# Patient Record
Sex: Female | Born: 1985 | Race: White | Hispanic: No | Marital: Single | Smoking: Former smoker
Health system: Southern US, Community
[De-identification: ages and names within clinical notes are randomized; demographics above are authoritative.]

## PROBLEM LIST (undated history)

## (undated) DIAGNOSIS — F419 Anxiety disorder, unspecified: Secondary | ICD-10-CM

## (undated) DIAGNOSIS — F32A Depression, unspecified: Secondary | ICD-10-CM

## (undated) DIAGNOSIS — F329 Major depressive disorder, single episode, unspecified: Secondary | ICD-10-CM

## (undated) DIAGNOSIS — F410 Panic disorder [episodic paroxysmal anxiety] without agoraphobia: Secondary | ICD-10-CM

## (undated) HISTORY — DX: Major depressive disorder, single episode, unspecified: F32.9

## (undated) HISTORY — PX: MOUTH SURGERY: SHX715

## (undated) HISTORY — DX: Depression, unspecified: F32.A

---

## 2008-01-21 ENCOUNTER — Emergency Department (HOSPITAL_COMMUNITY): Admission: EM | Admit: 2008-01-21 | Discharge: 2008-01-21 | Payer: Self-pay | Admitting: Emergency Medicine

## 2009-02-13 ENCOUNTER — Emergency Department (HOSPITAL_COMMUNITY): Admission: EM | Admit: 2009-02-13 | Discharge: 2009-02-13 | Payer: Self-pay | Admitting: Emergency Medicine

## 2010-07-18 LAB — POCT URINALYSIS DIP (DEVICE)
Bilirubin Urine: NEGATIVE
Glucose, UA: NEGATIVE mg/dL
Nitrite: NEGATIVE
Urobilinogen, UA: 0.2 mg/dL (ref 0.0–1.0)

## 2010-07-18 LAB — GC/CHLAMYDIA PROBE AMP, GENITAL
Chlamydia, DNA Probe: NEGATIVE
GC Probe Amp, Genital: NEGATIVE

## 2010-07-18 LAB — WET PREP, GENITAL

## 2011-01-22 ENCOUNTER — Inpatient Hospital Stay (INDEPENDENT_AMBULATORY_CARE_PROVIDER_SITE_OTHER)
Admission: RE | Admit: 2011-01-22 | Discharge: 2011-01-22 | Disposition: A | Discharge status: Home / Self Care | Payer: Self-pay | Source: Ambulatory Visit | Attending: Family Medicine | Admitting: Family Medicine

## 2011-01-22 DIAGNOSIS — L259 Unspecified contact dermatitis, unspecified cause: Secondary | ICD-10-CM

## 2011-04-11 ENCOUNTER — Encounter: Payer: Self-pay | Admitting: *Deleted

## 2011-04-11 ENCOUNTER — Emergency Department (INDEPENDENT_AMBULATORY_CARE_PROVIDER_SITE_OTHER)
Admission: EM | Admit: 2011-04-11 | Discharge: 2011-04-11 | Disposition: A | Discharge status: Home / Self Care | Payer: Self-pay | Source: Home / Self Care | Attending: Family Medicine | Admitting: Family Medicine

## 2011-04-11 DIAGNOSIS — F410 Panic disorder [episodic paroxysmal anxiety] without agoraphobia: Secondary | ICD-10-CM

## 2011-04-11 HISTORY — DX: Panic disorder (episodic paroxysmal anxiety): F41.0

## 2011-04-11 NOTE — ED Notes (Signed)
Pt     Reports  She  Had  A  Panic  Attack  This   Am   At  Work       With  Shortness of  Breath and  Anxiety   At  This  Time  She  Reports  That  She  Feels  Somewhat better  But  Her  Boss told  Her  To  Get  [employer]

## 2011-04-11 NOTE — ED Provider Notes (Signed)
History     CSN: [id number]  Arrival date & time 04/11/11  1111   First MD Initiated Contact with Patient 04/11/11 1222      Chief Complaint  Patient presents with  . Panic Attack    (Consider location/radiation/quality/duration/timing/severity/associated sxs/prior treatment) HPI Comments: Melissa Vang presents for evaluation after experiencing a panic attack at work this morning. She reports a hx of ONE panic attack, where she saw a health care provider but was not medicated. She denies any hx of mental disease, no suicidal ideation, no homicidal ideation. She reports lots of stress at work. She reports that her boss requested a medical evaluation as well as a note to return to work. Patient is a 25 y.o. female presenting with anxiety. The history is provided by the patient.  Anxiety This is a new problem. The current episode started 3 to 5 hours ago. The problem has been resolved. Associated symptoms include chest pain and shortness of breath. The symptoms are aggravated by nothing. The symptoms are relieved by nothing.    Past Medical History  Diagnosis Date  . Panic attack     Past Surgical History  Procedure Date  . Mouth surgery     Family History  Problem Relation Age of Onset  . Cancer Mother   . Diabetes Father     History  Substance Use Topics  . Smoking status: Current Everyday Smoker  . Smokeless tobacco: Not on file  . Alcohol Use: Yes    OB History    Grav Para Term Preterm Abortions TAB SAB Ect Mult Living                  Review of Systems  Constitutional: Negative.   HENT: Negative.   Eyes: Negative.   Respiratory: Positive for shortness of breath.   Cardiovascular: Positive for chest pain.  Gastrointestinal: Negative.   Genitourinary: Negative.   Musculoskeletal: Negative.   Skin: Negative.   Neurological: Negative.   Psychiatric/Behavioral: The patient is nervous/anxious.     Allergies  Penicillins  Home Medications  No current  outpatient prescriptions on file.  BP 112/70  Pulse 72  Temp(Src) 98.5 F (36.9 C) (Oral)  Resp 16  SpO2 97%  LMP 03/16/2011  Physical Exam  Nursing note and vitals reviewed. Constitutional: She is oriented to person, place, and time. She appears well-developed and well-nourished.  HENT:  Head: Normocephalic and atraumatic.  Eyes: Conjunctivae, EOM and lids are normal. Pupils are equal, round, and reactive to light.  Neck: Normal range of motion.  Cardiovascular: Normal rate and regular rhythm.   Pulmonary/Chest: Effort normal and breath sounds normal. She has no wheezes.  Musculoskeletal: Normal range of motion.  Neurological: She is alert and oriented to person, place, and time.  Skin: Skin is warm and dry.  Psychiatric: She has a normal mood and affect. Her speech is normal and behavior is normal. Judgment and thought content normal. Cognition and memory are normal.       Denies any SI or HI and states that the panic attack has resolved    ED Course  Procedures (including critical care time)  Labs Reviewed - No data to display No results found.   1. Panic attack       MDM  Resolved; given note for work; advised to follow up with PCP or [facility] should symptoms persist        Richardo Priest, MD 04/21/11 1738

## 2012-05-04 ENCOUNTER — Emergency Department (HOSPITAL_COMMUNITY)
Admission: EM | Admit: 2012-05-04 | Discharge: 2012-05-04 | Disposition: A | Discharge status: Home / Self Care | Payer: Self-pay | Attending: Emergency Medicine | Admitting: Emergency Medicine

## 2012-05-04 ENCOUNTER — Encounter (HOSPITAL_COMMUNITY): Payer: Self-pay | Admitting: *Deleted

## 2012-05-04 DIAGNOSIS — N946 Dysmenorrhea, unspecified: Secondary | ICD-10-CM | POA: Insufficient documentation

## 2012-05-04 DIAGNOSIS — N760 Acute vaginitis: Secondary | ICD-10-CM | POA: Insufficient documentation

## 2012-05-04 DIAGNOSIS — N390 Urinary tract infection, site not specified: Secondary | ICD-10-CM | POA: Insufficient documentation

## 2012-05-04 DIAGNOSIS — F17200 Nicotine dependence, unspecified, uncomplicated: Secondary | ICD-10-CM | POA: Insufficient documentation

## 2012-05-04 DIAGNOSIS — F410 Panic disorder [episodic paroxysmal anxiety] without agoraphobia: Secondary | ICD-10-CM | POA: Insufficient documentation

## 2012-05-04 DIAGNOSIS — Z7982 Long term (current) use of aspirin: Secondary | ICD-10-CM | POA: Insufficient documentation

## 2012-05-04 DIAGNOSIS — Z3202 Encounter for pregnancy test, result negative: Secondary | ICD-10-CM | POA: Insufficient documentation

## 2012-05-04 LAB — CBC WITH DIFFERENTIAL/PLATELET
Basophils Absolute: 0 K/uL (ref 0.0–0.1)
Basophils Relative: 0 % (ref 0–1)
Eosinophils Absolute: 0.1 K/uL (ref 0.0–0.7)
Eosinophils Relative: 1 % (ref 0–5)
HCT: 42.2 % (ref 36.0–46.0)
Hemoglobin: 15.1 g/dL — ABNORMAL HIGH (ref 12.0–15.0)
Lymphocytes Relative: 19 % (ref 12–46)
Lymphs Abs: 1.3 10*3/uL (ref 0.7–4.0)
MCH: 32.1 pg (ref 26.0–34.0)
MCHC: 35.8 g/dL (ref 30.0–36.0)
MCV: 89.8 fL (ref 78.0–100.0)
Monocytes Absolute: 0.3 10*3/uL (ref 0.1–1.0)
Monocytes Relative: 5 % (ref 3–12)
Neutro Abs: 4.8 10*3/uL (ref 1.7–7.7)
Neutrophils Relative %: 74 % (ref 43–77)
Platelets: 202 K/uL (ref 150–400)
RBC: 4.7 MIL/uL (ref 3.87–5.11)
RDW: 12.4 % (ref 11.5–15.5)
WBC: 6.5 K/uL (ref 4.0–10.5)

## 2012-05-04 LAB — URINALYSIS, ROUTINE W REFLEX MICROSCOPIC
Bilirubin Urine: NEGATIVE
Glucose, UA: NEGATIVE mg/dL
Ketones, ur: NEGATIVE mg/dL
Nitrite: NEGATIVE
Protein, ur: NEGATIVE mg/dL
Specific Gravity, Urine: 1.02 (ref 1.005–1.030)
Urobilinogen, UA: 0.2 mg/dL (ref 0.0–1.0)
pH: 5.5 (ref 5.0–8.0)

## 2012-05-04 LAB — COMPREHENSIVE METABOLIC PANEL
Albumin: 4.3 g/dL (ref 3.5–5.2)
Alkaline Phosphatase: 12 U/L — ABNORMAL LOW (ref 39–117)
BUN: 13 mg/dL (ref 6–23)
CO2: 24 mEq/L (ref 19–32)
Chloride: 103 mEq/L (ref 96–112)
Creatinine, Ser: 1.02 mg/dL (ref 0.50–1.10)
GFR calc Af Amer: 87 mL/min — ABNORMAL LOW (ref >90)
GFR calc non Af Amer: 75 mL/min — ABNORMAL LOW (ref >90)
Glucose, Bld: 87 mg/dL (ref 70–99)
Potassium: 3.5 mEq/L (ref 3.5–5.1)
Total Bilirubin: 0.5 mg/dL (ref 0.3–1.2)

## 2012-05-04 LAB — COMPREHENSIVE METABOLIC PANEL WITH GFR
ALT: 9 U/L (ref 0–35)
AST: 17 U/L (ref 0–37)
Calcium: 9.9 mg/dL (ref 8.4–10.5)
Sodium: 140 meq/L (ref 135–145)
Total Protein: 7.5 g/dL (ref 6.0–8.3)

## 2012-05-04 LAB — URINE MICROSCOPIC-ADD ON

## 2012-05-04 LAB — PREGNANCY, URINE: Preg Test, Ur: NEGATIVE

## 2012-05-04 LAB — LIPASE, BLOOD: Lipase: 39 U/L (ref 11–59)

## 2012-05-04 MED ORDER — METRONIDAZOLE 500 MG PO TABS: 500.0000 mg | ORAL_TABLET | Freq: Two times a day (BID) | ORAL | Status: DC

## 2012-05-04 MED ORDER — CIPROFLOXACIN HCL 500 MG PO TABS: 500.0000 mg | ORAL_TABLET | Freq: Two times a day (BID) | ORAL | Status: DC

## 2012-05-04 MED ORDER — ONDANSETRON HCL 4 MG PO TABS: 4.0000 mg | ORAL_TABLET | Freq: Four times a day (QID) | ORAL | Status: DC

## 2012-05-04 MED ORDER — HYDROCODONE-ACETAMINOPHEN 5-325 MG PO TABS: 1.0000 | ORAL_TABLET | ORAL | Status: DC | PRN

## 2012-05-04 NOTE — ED Provider Notes (Signed)
Medical screening examination/treatment/procedure(s) were performed by non-physician practitioner and as supervising physician I was immediately available for consultation/collaboration.   Gavin Pound. Amardeep Beckers, MD 05/04/12 1745

## 2012-05-04 NOTE — ED Notes (Signed)
The pt hashad nausea vomiting with lower abd pain since this am.  lmp dec 15th

## 2012-05-04 NOTE — ED Provider Notes (Signed)
History     CSN: [id number]  Arrival date & time 05/04/12  1453   First MD Initiated Contact with Patient 05/04/12 1629      Chief Complaint  Patient presents with  . Abdominal Pain    (Consider location/radiation/quality/duration/timing/severity/associated sxs/prior treatment) HPI  Patient presents to the ED with complaints of low abdominal pains that started over 1 week ago. She was seen a week ago and given one dose of medication while in the ER and prescriptions for antibiotics which she never got filled. She didn't get them filled because she said she could not afford them. This morning she began having nausea and spit up one time "only a mouthful" one time. The patient brought her paper work from the hospital in with her and it shows she was diagnosed with vaginal infection. She was given her first dose of Flagyl, doxy and Azithro in ED but was unable to follow-up. nad vss   Past Medical History  Diagnosis Date  . Panic attack     Past Surgical History  Procedure Date  . Mouth surgery     Family History  Problem Relation Age of Onset  . Cancer Mother   . Diabetes Father     History  Substance Use Topics  . Smoking status: Current Every Day Smoker  . Smokeless tobacco: Not on file  . Alcohol Use: Yes    OB History    Grav Para Term Preterm Abortions TAB SAB Ect Mult Living                  Review of Systems  Review of Systems  Gen: no weight loss, fevers, chills, night sweats  Eyes: no discharge or drainage, no occular pain or visual changes  Nose: no epistaxis or rhinorrhea  Mouth: no dental pain, no sore throat  Neck: no neck pain  Lungs:No wheezing, coughing or hemoptysis CV: no chest pain, palpitations, dependent edema or orthopnea  Abd: + suprapubic abdominal pain, nausea, small amount of vomiting  GU: + dysuria no gross hematuria  MSK:  No abnormalities  Neuro: no headache, no focal neurologic deficits  Skin: no abnormalities Psyche:  negative.   Allergies  Penicillins  Home Medications   Current Outpatient Rx  Name  Route  Sig  Dispense  Refill  . ACETAMINOPHEN 500 MG PO TABS   Oral   Take 1,000 mg by mouth 2 (two) times daily as needed. For pain         . ASPIRIN PO   Oral   Take 2,000 mg by mouth daily as needed. For pain         . TRAMADOL HCL 50 MG PO TABS   Oral   Take 50 mg by mouth 2 (two) times daily as needed. For pain           BP 140/89  Pulse 91  Temp 98.3 F (36.8 C) (Oral)  Resp 14  SpO2 100%  LMP 03/27/2012  Physical Exam  Nursing note and vitals reviewed. Constitutional: She appears well-developed and well-nourished. No distress.  HENT:  Head: Normocephalic and atraumatic.  Eyes: Pupils are equal, round, and reactive to light.  Neck: Normal range of motion. Neck supple.  Cardiovascular: Normal rate and regular rhythm.   Pulmonary/Chest: Effort normal.  Abdominal: Soft.  Genitourinary:       Pt declines pelvic because she had one a few days ago a  Neurological: She is alert.  Skin: Skin is warm and dry.  ED Course  Procedures (including critical care time)  Labs Reviewed  URINALYSIS, ROUTINE W REFLEX MICROSCOPIC - Abnormal; Notable for the following:    APPearance CLOUDY (*)     Hgb urine dipstick LARGE (*)     Leukocytes, UA MODERATE (*)     All other components within normal limits  URINE MICROSCOPIC-ADD ON - Abnormal; Notable for the following:    Squamous Epithelial / LPF MANY (*)     Bacteria, UA MANY (*)     All other components within normal limits  PREGNANCY, URINE  CBC WITH DIFFERENTIAL  COMPREHENSIVE METABOLIC PANEL  LIPASE, BLOOD  URINE CULTURE   No results found.   No diagnosis found.  Dx: UTI and BV  MDM  Pt declines pelvic because she just recently got one and says that it hurts. She requests that we get her files transferred from [facility] in [place]. I told her that we will try and if  we can't get it within 30 minutes we will  need to redo everything.  Pt started period while in ED  Results from [facility] show Wet mount:  many bacteria   Many clue cells Moderate WBCs No yeast NO trichomonas  Urinalysis: Negative Negative Urine pregnancy test  GC Cultures: Negative  She was given 1,000 mg of Azithromycin and 100mg  of Doxycycline in ED.   Will treat patient for UTI and BV. - Cipro and Flagyl  She is not febrile, or having vomiting and severe abdominal pain. IV antibiotics are indicated.   Pt has been advised of the symptoms that warrant their return to the ED. Patient has voiced understanding and has agreed to follow-up with the PCP or specialist.      Dorthula Matas, PA 05/04/12 1743

## 2012-05-05 LAB — URINE CULTURE

## 2013-09-29 ENCOUNTER — Encounter (HOSPITAL_COMMUNITY): Payer: Self-pay | Admitting: Emergency Medicine

## 2013-09-29 ENCOUNTER — Emergency Department (HOSPITAL_COMMUNITY)
Admission: EM | Admit: 2013-09-29 | Discharge: 2013-09-30 | Disposition: A | Discharge status: Home / Self Care | Payer: Self-pay | Attending: Emergency Medicine | Admitting: Emergency Medicine

## 2013-09-29 DIAGNOSIS — Z8659 Personal history of other mental and behavioral disorders: Secondary | ICD-10-CM | POA: Insufficient documentation

## 2013-09-29 DIAGNOSIS — Z7982 Long term (current) use of aspirin: Secondary | ICD-10-CM | POA: Insufficient documentation

## 2013-09-29 DIAGNOSIS — Z880 Allergy status to penicillin: Secondary | ICD-10-CM | POA: Insufficient documentation

## 2013-09-29 DIAGNOSIS — J020 Streptococcal pharyngitis: Secondary | ICD-10-CM | POA: Insufficient documentation

## 2013-09-29 DIAGNOSIS — Z79899 Other long term (current) drug therapy: Secondary | ICD-10-CM | POA: Insufficient documentation

## 2013-09-29 DIAGNOSIS — F17200 Nicotine dependence, unspecified, uncomplicated: Secondary | ICD-10-CM | POA: Insufficient documentation

## 2013-09-29 DIAGNOSIS — Z792 Long term (current) use of antibiotics: Secondary | ICD-10-CM | POA: Insufficient documentation

## 2013-09-29 NOTE — ED Notes (Signed)
Pt. reports sore throat with swelling and headache onset last night , denies fever or cough.

## 2013-09-30 LAB — RAPID STREP SCREEN (MED CTR MEBANE ONLY): STREPTOCOCCUS, GROUP A SCREEN (DIRECT): POSITIVE — AB

## 2013-09-30 MED ORDER — HYDROCODONE-ACETAMINOPHEN 5-325 MG PO TABS: 1.0000 | ORAL_TABLET | ORAL | Status: DC | PRN

## 2013-09-30 MED ORDER — CLINDAMYCIN HCL 150 MG PO CAPS: 300.0000 mg | ORAL_CAPSULE | Freq: Four times a day (QID) | ORAL | Status: DC

## 2013-09-30 MED ORDER — CLINDAMYCIN HCL 150 MG PO CAPS
300.0000 mg | ORAL_CAPSULE | Freq: Once | ORAL | Status: AC
  Administered 2013-09-30: 300 mg via ORAL
  Filled 2013-09-30: qty 2

## 2013-09-30 NOTE — Discharge Instructions (Signed)
Your Strep throat test was positive.  Please take the antibiotics as prescribed.  Return for any worsening symptoms.    Strep Throat Strep throat is an infection of the throat caused by a bacteria named Streptococcus pyogenes. Your caregiver may call the infection streptococcal "tonsillitis" or "pharyngitis" depending on whether there are signs of inflammation in the tonsils or back of the throat. Strep throat is most common in children aged 28-15 years during the cold months of the year, but it can occur in people of any age during any season. This infection is spread from person to person (contagious) through coughing, sneezing, or other close contact. SYMPTOMS   Fever or chills.  Painful, swollen, red tonsils or throat.  Pain or difficulty when swallowing.  White or yellow spots on the tonsils or throat.  Swollen, tender lymph nodes or "glands" of the neck or under the jaw.  Red rash all over the body (rare). DIAGNOSIS  Many different infections can cause the same symptoms. A test must be done to confirm the diagnosis so the right treatment can be given. A "rapid strep test" can help your caregiver make the diagnosis in a few minutes. If this test is not available, a light swab of the infected area can be used for a throat culture test. If a throat culture test is done, results are usually available in a day or two. TREATMENT  Strep throat is treated with antibiotic medicine. HOME CARE INSTRUCTIONS   Gargle with 1 tsp of salt in 1 cup of warm water, 3-4 times per day or as needed for comfort.  Family members who also have a sore throat or fever should be tested for strep throat and treated with antibiotics if they have the strep infection.  Make sure everyone in your household washes their hands well.  Do not share food, drinking cups, or personal items that could cause the infection to spread to others.  You may need to eat a soft food diet until your sore throat gets  better.  Drink enough water and fluids to keep your urine clear or pale yellow. This will help prevent dehydration.  Get plenty of rest.  Stay home from school, daycare, or work until you have been on antibiotics for 24 hours.  Only take over-the-counter or prescription medicines for pain, discomfort, or fever as directed by your caregiver.  If antibiotics are prescribed, take them as directed. Finish them even if you start to feel better. SEEK MEDICAL CARE IF:   The glands in your neck continue to enlarge.  You develop a rash, cough, or earache.  You cough up green, yellow-brown, or bloody sputum.  You have pain or discomfort not controlled by medicines.  Your problems seem to be getting worse rather than better. SEEK IMMEDIATE MEDICAL CARE IF:   You develop any new symptoms such as vomiting, severe headache, stiff or painful neck, chest pain, shortness of breath, or trouble swallowing.  You develop severe throat pain, drooling, or changes in your voice.  You develop swelling of the neck, or the skin on the neck becomes red and tender.  You have a fever.  You develop signs of dehydration, such as fatigue, dry mouth, and decreased urination.  You become increasingly sleepy, or you cannot wake up completely. Document Released: 03/29/2000 Document Revised: 03/18/2012 Document Reviewed: 05/31/2010 [facility] Patient Information 2015 [place], [place]. This information is not intended to replace advice given to you by your health care provider. Make sure you discuss any  questions you have with your health care provider. ° °

## 2013-09-30 NOTE — ED Provider Notes (Signed)
CSN: [id number]     Arrival date & time 09/29/13  2246 History   First [id number] Initiated Contact with Patient 09/29/13 2345     Chief Complaint  Patient presents with  . Sore Throat   HPI  History provided by the patient. Patient is a 28 year old female with no significant PMH presenting with complaints of sore throat. Patient reports developing a sore throat last night and all day today. Pain is worse with swallowing or eating. She did take some Tylenol to help with symptoms but did not have much relief. She denies any associated cough, congestion, or fever. There has not been any vomiting or diarrhea symptoms. Denies any known sick contacts. No other aggravating or alleviating factors. No other associated symptoms.   Past Medical History  Diagnosis Date  . Panic attack    Past Surgical History  Procedure Laterality Date  . Mouth surgery     Family History  Problem Relation Age of Onset  . Cancer Mother   . Diabetes Father    History  Substance Use Topics  . Smoking status: Current Every Day Smoker  . Smokeless tobacco: Not on file  . Alcohol Use: Yes   OB History   Grav Para Term Preterm Abortions TAB SAB Ect Mult Living                 Review of Systems  Constitutional: Positive for chills. Negative for fever.  HENT: Positive for sore throat. Negative for congestion and rhinorrhea.   Respiratory: Negative for cough.   Gastrointestinal: Negative for vomiting and diarrhea.  All other systems reviewed and are negative.     Allergies  Penicillins  Home Medications   Prior to Admission medications   Medication Sig Start Date End Date Taking? Authorizing Shaneeka Scarboro  acetaminophen (TYLENOL) 500 MG tablet Take 1,000 mg by mouth 2 (two) times daily as needed. For pain    Historical Keithon Mccoin, MD  ASPIRIN PO Take 2,000 mg by mouth daily as needed. For pain    Historical Rolland Steinert, MD  ciprofloxacin (CIPRO) 500 MG tablet Take 1 tablet (500 mg total) by mouth 2 (two) times daily.  05/04/12   Tiffany Irine SealG Greene, PA-C  clindamycin (CLEOCIN) 150 MG capsule Take 2 capsules (300 mg total) by mouth every 6 (six) hours. 09/30/13   Angus SellerPeter S Dammen, PA-C  HYDROcodone-acetaminophen (NORCO/VICODIN) 5-325 MG per tablet Take 1 tablet by mouth every 4 (four) hours as needed for pain. 05/04/12   Tiffany Irine SealG Greene, PA-C  HYDROcodone-acetaminophen (NORCO/VICODIN) 5-325 MG per tablet Take 1 tablet by mouth every 4 (four) hours as needed for moderate pain. 09/30/13   Phill MutterPeter S Dammen, PA-C  metroNIDAZOLE (FLAGYL) 500 MG tablet Take 1 tablet (500 mg total) by mouth 2 (two) times daily. 05/04/12   Tiffany Irine SealG Greene, PA-C  ondansetron (ZOFRAN) 4 MG tablet Take 1 tablet (4 mg total) by mouth every 6 (six) hours. 05/04/12   Tiffany Irine SealG Greene, PA-C  traMADol (ULTRAM) 50 MG tablet Take 50 mg by mouth 2 (two) times daily as needed. For pain    Historical Zionna Homewood, MD   BP 108/59  Pulse 98  Temp(Src) 98.7 F (37.1 C) (Oral)  Resp 16  Ht 5\' 4"  (1.626 m)  Wt 143 lb (64.864 kg)  BMI 24.53 kg/m2  SpO2 97%  LMP 09/20/2013 Physical Exam  Nursing note and vitals reviewed. Constitutional: She is oriented to person, place, and time. She appears well-developed and well-nourished. No distress.  HENT:  Head: Normocephalic.  Right Ear: Tympanic membrane normal.  Left Ear: Tympanic membrane normal.  There is diffuse erythema of the tonsils and pharynx. Exudate on the tonsils and pharynx present. Uvula midline. No signs concerning for PTA.  Neck: Normal range of motion. Neck supple.  No meningeal signs  Cardiovascular: Normal rate and regular rhythm.   Pulmonary/Chest: Effort normal and breath sounds normal.  Abdominal: Soft.  Lymphadenopathy:    She has cervical adenopathy.  Neurological: She is alert and oriented to person, place, and time.  Skin: Skin is warm and dry. No rash noted.  Psychiatric: She has a normal mood and affect. Her behavior is normal.    ED Course  Procedures   Patient seen and  evaluated. Does not appear severely ill or toxic. Symptoms on exam concerning for a strep throat.  Strep test positive. Patient with penicillin allergy. Will start on clindamycin.  Labs Review Labs Reviewed  RAPID STREP SCREEN - Abnormal; Notable for the following:    Streptococcus, Group A Screen (Direct) POSITIVE (*)    All other components within normal limits     MDM   Final diagnoses:  Strep throat       Angus Sellereter S Dammen, PA-C 09/30/13 [phone]

## 2013-09-30 NOTE — ED Provider Notes (Signed)
Medical screening examination/treatment/procedure(s) were performed by non-physician practitioner and as supervising physician I was immediately available for consultation/collaboration.   EKG Interpretation None       Ankit Nanavati, MD 09/30/13 0800 

## 2017-05-27 ENCOUNTER — Ambulatory Visit: Payer: Self-pay | Admitting: Internal Medicine

## 2017-05-27 ENCOUNTER — Encounter: Payer: Self-pay | Admitting: Internal Medicine

## 2017-05-27 VITALS — BP 122/82 | HR 60 | Resp 12 | Ht 63.5 in | Wt 132.0 lb

## 2017-05-27 DIAGNOSIS — R634 Abnormal weight loss: Secondary | ICD-10-CM

## 2017-05-27 DIAGNOSIS — F32A Depression, unspecified: Secondary | ICD-10-CM

## 2017-05-27 DIAGNOSIS — J0100 Acute maxillary sinusitis, unspecified: Secondary | ICD-10-CM

## 2017-05-27 DIAGNOSIS — F410 Panic disorder [episodic paroxysmal anxiety] without agoraphobia: Secondary | ICD-10-CM | POA: Insufficient documentation

## 2017-05-27 DIAGNOSIS — F419 Anxiety disorder, unspecified: Secondary | ICD-10-CM

## 2017-05-27 DIAGNOSIS — F329 Major depressive disorder, single episode, unspecified: Secondary | ICD-10-CM

## 2017-05-27 MED ORDER — AZITHROMYCIN 500 MG PO TABS: ORAL_TABLET | ORAL | 0 refills | Status: DC

## 2017-05-27 NOTE — Progress Notes (Signed)
Subjective:    Patient ID: Melissa Vang, female    DOB: [birth year], 32 y.o.   MRN: [id number]  HPI   1.  Concern for exposure to black mold and multiple symptoms.  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  [address] [place], [place]. Lived there regularly from August 2018 until beginning of January 2019, when she started staying with her friends instead due to upper respiratory issues, headaches.  She also has tremulousness now as well, but has been out for 1 month for most part. Reportedly has seen black mold on door to outside and into bathrooms, baseboards.  Feels there is mold on furniture.  Has ruined shoes and clothing. She rents a home off of [address].  Reportedly previous tenant had problems with this as well.   Rents from management company, [employer].  Is concerned she will be evicted if a problem.   Has shown then photos.  Also water damage on ceiling.   Reportedly, the management company wants her to sign a disclaimer to get her security deposit back.   She does have rent she owes, but has not paid because of problems with the mold issues.   She is to out of the house by the 16th of this month, sign a disclaimer to get her deposit. Not clear her sinus symptoms and headaches have improved leaving the house. Will be staying with friends once she leaves this house.  2.  Depression and Anxiety:  Started in high school.  Did not get treatment then.  States this lasted only 2 weeks.   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  Her housing situation made this worse.   She started going to [place] beginning of January.   Taking Zoloft 100 mg daily.  Also Hydroxyzine for insomnia and insomnia 25-50 mg at bedtime. She reportedly is also diagnosed with PTSD from past family experiences. She does feel she is better on her current  regimen. Annette Stable is her counselor there.  3.  Sinus congestion:  See above.  Headache pain in her nasal and maxillary sinus area.  +pressure around and behind her eyes.  No definite posterior pharyngeal drainage.  Previously, had a lot of thick green phlegm with blood mixed in at times. During active duty last June-July, was given Flonase and zyrtec with some improvement.  She believes she was treated with unknown antibiotic, which sounds like a Zpack. She does believe she has had seasonal allergies in past.  4.  Weight loss:  States her usual weight is 145-150 lbs.  Started losing weight a couple of months ago.  Difficult history, but her weight has always fluctuated.  States she feels she has had tenderness over her "goiter".  This was beginning of January.   Her appetite is fine.  Feels when she eats, she needs to use the bathroom soon after eating.  Her stools are watery, other times just really soft.  She has not had watery stools for a couple of weeks.   Noted the tremulousness about the same as her weight loss. Maybe a bit hot all the time, but more so both hot and cold.  Feels her hair is dry and brittle, her hands are dry.   No increased appetite from usual, though prior to getting her depression and anxiety treated, was not  eating well.  Some palpitations at times.    Current Meds  Medication Sig  . acetaminophen (TYLENOL) 500 MG tablet Take 1,000 mg by mouth 2 (two) times daily as needed. For pain  . hydrOXYzine (ATARAX/VISTARIL) 25 MG tablet Take 25 mg by mouth. 1-2 daily at bedtime as needed  . sertraline (ZOLOFT) 100 MG tablet Take 100 mg by mouth daily.    Allergies  Allergen Reactions  . Penicillins Anaphylaxis    Throat swelling in particular    Past Medical History:  Diagnosis Date  . Panic attack    Past Surgical History:  Procedure Laterality Date  . MOUTH SURGERY      Family History  Problem Relation Age of Onset  . Cancer Mother   . Diabetes Father      Social History   Socioeconomic History  . Marital status: Single    Spouse name: Not on file  . Number of children: Not on file  . Years of education: Not on file  . Highest education level: Not on file  Social Needs  . Financial resource strain: Not on file  . Food insecurity - worry: Not on file  . Food insecurity - inability: Not on file  . Transportation needs - medical: Not on file  . Transportation needs - non-medical: Not on file  Occupational History  . Not on file  Tobacco Use  . Smoking status: Former Games developermoker  . Smokeless tobacco: Never Used  Substance and Sexual Activity  . Alcohol use: Yes  . Drug use: Not on file  . Sexual activity: Not on file  Other Topics Concern  . Not on file  Social History Narrative  . Not on file        Review of Systems     Objective:   Physical Exam  Anxious appearing. Holds shoulders up near ears. HEENT: PERRL, EOMI, no exophthalmos or lid lag.   TMs pearly gray, nasal mucosa red and swollen with yellow crusting.  Throat without injection.  MMM Neck:  Supple, No adenopathy.  No thyromegaly or thyroid mass.  No thyroid bruit. Chest:  CTA CV:  RRR with normal S1 and S2, no S3, S4, or murmur.  Radial and DP pulses normal and equal Abd:  S, NT, No HSM or mass, + BS Skin:  Many tattoos.  No obvious dryness or velvety feel. Neuro:  DTRs 2+/4. Mild tremulousness of hands      Assessment & Plan:  1.  M[facility] in [facility] rental [facility] Patient filled out [facility] paperwork  2.  Sinusitis:  Once she has moved her belongings out of the rental property, will have her start Azithromycin 500 mg daily for 7 days course.  She will rinse sinuses with neti pot daily as well.  3.  Weight loss:  CBC, CMP, TSH.    4.  Anxiety and Depression:  As per [facility].   Follow up in 1 month to recheck and get repeat weight.

## 2017-05-28 LAB — COMPREHENSIVE METABOLIC PANEL
ALK PHOS: 15 IU/L — AB (ref 39–117)
ALT: 12 IU/L (ref 0–32)
AST: 15 IU/L (ref 0–40)
Albumin/Globulin Ratio: 1.7 (ref 1.2–2.2)
Albumin: 5 g/dL (ref 3.5–5.5)
BUN/Creatinine Ratio: 17 (ref 9–23)
BUN: 15 mg/dL (ref 6–20)
Bilirubin Total: 0.4 mg/dL (ref 0.0–1.2)
CALCIUM: 9.9 mg/dL (ref 8.7–10.2)
CO2: 23 mmol/L (ref 20–29)
CREATININE: 0.86 mg/dL (ref 0.57–1.00)
Chloride: 101 mmol/L (ref 96–106)
GFR calc Af Amer: 103 mL/min/{1.73_m2} (ref >59)
GFR calc non Af Amer: 90 mL/min/{1.73_m2} (ref >59)
GLOBULIN, TOTAL: 2.9 g/dL (ref 1.5–4.5)
Glucose: 82 mg/dL (ref 65–99)
POTASSIUM: 4.8 mmol/L (ref 3.5–5.2)
SODIUM: 142 mmol/L (ref 134–144)
Total Protein: 7.9 g/dL (ref 6.0–8.5)

## 2017-05-28 LAB — CBC WITH DIFFERENTIAL/PLATELET
Basophils Absolute: 0 10*3/uL (ref 0.0–0.2)
Basos: 0 %
EOS (ABSOLUTE): 0.1 10*3/uL (ref 0.0–0.4)
EOS: 2 %
HEMATOCRIT: 44.7 % (ref 34.0–46.6)
Hemoglobin: 14.9 g/dL (ref 11.1–15.9)
IMMATURE GRANULOCYTES: 0 %
Immature Grans (Abs): 0 10*3/uL (ref 0.0–0.1)
LYMPHS ABS: 1.8 10*3/uL (ref 0.7–3.1)
Lymphs: 39 %
MCH: 31.5 pg (ref 26.6–33.0)
MCHC: 33.3 g/dL (ref 31.5–35.7)
MCV: 95 fL (ref 79–97)
Monocytes Absolute: 0.4 10*3/uL (ref 0.1–0.9)
Monocytes: 8 %
NEUTROS PCT: 51 %
Neutrophils Absolute: 2.4 10*3/uL (ref 1.4–7.0)
Platelets: 288 10*3/uL (ref 150–379)
RBC: 4.73 x10E6/uL (ref 3.77–5.28)
RDW: 13.7 % (ref 12.3–15.4)
WBC: 4.6 10*3/uL (ref 3.4–10.8)

## 2017-05-28 LAB — TSH: TSH: 2.3 u[IU]/mL (ref 0.450–4.500)

## 2017-05-29 ENCOUNTER — Telehealth: Payer: Self-pay | Admitting: Licensed Clinical Social Worker

## 2017-05-29 NOTE — Telephone Encounter (Signed)
Social TEFL teacherWorker intern called pt to see if pt needed any additional services that were not mention when arrival at the clinic.Pt was not available so SWI left a voice message and a call back number as well offered counseling session if pt was interested.

## 2017-06-25 ENCOUNTER — Ambulatory Visit: Payer: Self-pay | Admitting: Internal Medicine

## 2017-07-16 ENCOUNTER — Ambulatory Visit (INDEPENDENT_AMBULATORY_CARE_PROVIDER_SITE_OTHER): Payer: Self-pay | Admitting: Internal Medicine

## 2017-07-16 ENCOUNTER — Encounter: Payer: Self-pay | Admitting: Internal Medicine

## 2017-07-16 VITALS — BP 122/82 | HR 78 | Resp 12 | Ht 63.5 in | Wt 136.0 lb

## 2017-07-16 DIAGNOSIS — Z599 Problem related to housing and economic circumstances, unspecified: Secondary | ICD-10-CM

## 2017-07-16 DIAGNOSIS — Z591 Inadequate housing: Secondary | ICD-10-CM

## 2017-07-16 DIAGNOSIS — F410 Panic disorder [episodic paroxysmal anxiety] without agoraphobia: Secondary | ICD-10-CM

## 2017-07-16 DIAGNOSIS — J3089 Other allergic rhinitis: Secondary | ICD-10-CM

## 2017-07-16 DIAGNOSIS — R51 Headache: Secondary | ICD-10-CM

## 2017-07-16 DIAGNOSIS — R634 Abnormal weight loss: Secondary | ICD-10-CM

## 2017-07-16 DIAGNOSIS — R519 Headache, unspecified: Secondary | ICD-10-CM

## 2017-07-16 MED ORDER — FLUTICASONE PROPIONATE 50 MCG/ACT NA SUSP: 2.0000 | Freq: Every day | NASAL | 6 refills | Status: DC

## 2017-07-16 MED ORDER — FEXOFENADINE HCL 180 MG PO TABS: 180.0000 mg | ORAL_TABLET | Freq: Every day | ORAL | Status: DC

## 2017-07-16 NOTE — Progress Notes (Signed)
   Subjective:    Patient ID: Melissa Vang, female    DOB: 10-07-1985, 32 y.o.   MRN: [id number]  HPI   1.  Mold and Mildew in old rental property.  [facility], Herbert MoorsKen Cook, who toured the property and reportedly felt there was a significant problem.  Not clear whether [facility] with [facility] for the new [facility] there.  The patient's owed rental apparently has been wiped away and she has downpayment placed for new property.  2.  Headaches:  Still with these.  Not taking zyrtec and Fluticasone nasal now.  Does not have an orange card as of yet.  Not using neti pot.  3.  Weight Loss:  Weight up today by 4 lbs.  4.  Funny pain in right parietal area.  There like a "zap"  To the right side of the crown of her head.  Sometimes lasts several minutes.  She admits to missing the Zoloft or being late with the medication.  She is unable to say if the head symptoms are on the days she misses or is late, but will pay attention in the future.  5.  Noting intermittent slow or fast heart beat.  Can have the latter when awakening in the morning.  Again, not sure if days when missing Zoloft or late with dose.    Current Meds  Medication Sig  . acetaminophen (TYLENOL) 500 MG tablet Take 1,000 mg by mouth 2 (two) times daily as needed. For pain  . hydrOXYzine (ATARAX/VISTARIL) 25 MG tablet Take 25 mg by mouth. 1-2 daily at bedtime as needed  . sertraline (ZOLOFT) 100 MG tablet Take 100 mg by mouth daily.    Allergies  Allergen Reactions  . Penicillins Anaphylaxis    Throat swelling in particular    Review of Systems     Objective:   Physical Exam NAD HEENT:  PERRL, EOMI, TMs pearly gray, nasal mucosa a bit swollen and erythematous. Throat with significant cobbling of posterior pharynx. Neck:  Supple, No adenopathy, no thyromegaly Chest:  CTA CV:  RRR without murmur or rub, radial pulses normal and equal Neuro:  A & O x 3, CN  II-XII grossly intact DTRs 2+/4 throughout, Motor 5/5, Sensory  grossly normal to light touch.       Assessment & Plan:  1.  Unhealthy Housing:  Not clear if attention for health of new renter will be given.  Will check with [facility], Herbert MoorsKen Cook  Pt. Improved since moved out and her costs have been defrayed.  2.  Headaches/Allergies:  Definitely with allergies on exam today.  Encouraged her to return to Zyrtec 10 mg daily, Fluticasone nasal spray 2 sprays each nostril daily after using neti pot or snorting saline.  May switch to Fexofenadine if more affordable than Zyrtec. If no improvement in headaches, to call and will consider a course of antibiotics for sinusitis.  3.  New head discomfort and palpitations:  To pay attention to days she is late or missing Zoloft.  Suspect that is the cause.  4.  Weight loss:  Resolved.  Follow up in 3-4 months for CPE with pap.  FLP 2 days prior.

## 2017-07-19 ENCOUNTER — Encounter (HOSPITAL_COMMUNITY): Payer: Self-pay

## 2017-07-19 ENCOUNTER — Other Ambulatory Visit: Payer: Self-pay

## 2017-07-19 DIAGNOSIS — J029 Acute pharyngitis, unspecified: Secondary | ICD-10-CM | POA: Insufficient documentation

## 2017-07-19 DIAGNOSIS — Z87891 Personal history of nicotine dependence: Secondary | ICD-10-CM | POA: Insufficient documentation

## 2017-07-19 NOTE — ED Triage Notes (Signed)
Saw doctor a couple days ago and dx with allergies, and today sore throat sinus pressure and coughing up yellowish/greenish secretions.

## 2017-07-20 ENCOUNTER — Emergency Department (HOSPITAL_COMMUNITY)
Admission: EM | Admit: 2017-07-20 | Discharge: 2017-07-20 | Disposition: A | Discharge status: Home / Self Care | Payer: Self-pay | Attending: Emergency Medicine | Admitting: Emergency Medicine

## 2017-07-20 DIAGNOSIS — J029 Acute pharyngitis, unspecified: Secondary | ICD-10-CM

## 2017-07-20 MED ORDER — GI COCKTAIL ~~LOC~~
30.0000 mL | Freq: Once | ORAL | Status: AC
Start: 2017-07-20 — End: 2017-07-20
  Administered 2017-07-20: 30 mL via ORAL
  Filled 2017-07-20: qty 30

## 2017-07-20 MED ORDER — KETOROLAC TROMETHAMINE 60 MG/2ML IM SOLN
60.0000 mg | Freq: Once | INTRAMUSCULAR | Status: AC
  Administered 2017-07-20: 60 mg via INTRAMUSCULAR
  Filled 2017-07-20: qty 2

## 2017-07-20 MED ORDER — AZITHROMYCIN 250 MG PO TABS: 250.0000 mg | ORAL_TABLET | Freq: Every day | ORAL | 0 refills | Status: DC

## 2017-07-20 MED ORDER — IBUPROFEN 600 MG PO TABS: 600.0000 mg | ORAL_TABLET | Freq: Four times a day (QID) | ORAL | 0 refills | Status: DC | PRN

## 2017-07-20 MED ORDER — DEXAMETHASONE SODIUM PHOSPHATE 10 MG/ML IJ SOLN
10.0000 mg | Freq: Once | INTRAMUSCULAR | Status: AC
  Administered 2017-07-20: 10 mg via INTRAMUSCULAR
  Filled 2017-07-20: qty 1

## 2017-07-20 MED ORDER — PHENOL 1.4 % MT LIQD
1.0000 | OROMUCOSAL | 0 refills | Status: DC | PRN
Start: 2017-07-20 — End: 2022-04-01

## 2017-07-20 MED ORDER — AZITHROMYCIN 250 MG PO TABS
500.0000 mg | ORAL_TABLET | Freq: Once | ORAL | Status: AC
  Administered 2017-07-20: 500 mg via ORAL
  Filled 2017-07-20: qty 2

## 2017-07-20 NOTE — ED Notes (Signed)
Bed: [id number] Expected date: 07/20/17 Expected time: 7:00 AM Means of arrival:  Comments:

## 2017-07-20 NOTE — ED Provider Notes (Signed)
[facility]-EMERGENCY DEPT Provider Note   CSN: [id number] Arrival date & time: 07/19/17  2246    History   Chief Complaint Chief Complaint  Patient presents with  . Cough    HPI Melissa Vang is a 32 y.o. female.  32 year old female presents to the emergency department for sore throat.  She reports worsening sore throat today with initial symptoms beginning a few days ago.  She has had pain with swallowing as well as low-grade fever of 100.6 F.  She has been medicating with Tylenol and ibuprofen with little improvement.  No associated cough, nausea, vomiting.  No reported sick contacts.  The history is provided by the patient. No language interpreter was used.  Cough     Past Medical History:  Diagnosis Date  . Depression    [facility]  . Panic attack    [facility]  [facility]    Patient Active Problem List   Diagnosis Date Noted  . Depression   . Panic attack     Past Surgical History:  Procedure Laterality Date  . MOUTH SURGERY    . MOUTH SURGERY  age 32   for impacted tooth in hard palate     OB History   None      Home Medications    Prior to Admission medications   Medication Sig Start Date End Date Taking? Authorizing Provider  acetaminophen (TYLENOL) 500 MG tablet Take 1,000 mg by mouth 2 (two) times daily as needed. For pain    [provider]  azithromycin (ZITHROMAX) 250 MG tablet Take 1 tablet (250 mg total) by mouth daily. Take 1 every day until finished. 07/20/17   Antony MaduraHumes, Magdelene Ruark, PA-C  fexofenadine (ALLEGRA) 180 MG tablet Take 1 tablet (180 mg total) by mouth daily. 07/16/17   Julieanne MansonMulberry, Elizabeth, MD  fluticasone (FLONASE) 50 MCG/ACT nasal spray Place 2 sprays into both nostrils daily. 07/16/17   Julieanne MansonMulberry, Elizabeth, MD  hydrOXYzine (ATARAX/VISTARIL) 25 MG tablet Take 25 mg by mouth. 1-2 daily at bedtime as needed    [provider]  ibuprofen (ADVIL,MOTRIN) 600 MG  tablet Take 1 tablet (600 mg total) by mouth every 6 (six) hours as needed. 07/20/17   Antony MaduraHumes, Jenina Moening, PA-C  phenol (CHLORASEPTIC) 1.4 % LIQD Use as directed 1 spray in the mouth or throat as needed for throat irritation / pain. 07/20/17   Antony MaduraHumes, Gerber Penza, PA-C  sertraline (ZOLOFT) 100 MG tablet Take 100 mg by mouth daily.    [provider]    Family History Family History  Problem Relation Age of Onset  . Diabetes Mother   . Arthritis Mother        disabled due to multiple joint and spine issues  . Cancer Mother        "precancer of ovaries"  . Diabetes Father     Social History Social History   Tobacco Use  . Smoking status: Former Smoker    Packs/day: 0.25    Years: 18.00    Pack years: 4.50    Types: Cigarettes  . Smokeless tobacco: Never Used  Substance Use Topics  . Alcohol use: Yes    Comment: 1-2 drinks per week  . Drug use: No     Allergies   Penicillins   Review of Systems Review of Systems  Respiratory: Positive for cough.    Ten systems reviewed and are negative for acute change, except as noted in the HPI.    Physical  Exam Updated Vital Signs BP 123/76 (BP Location: Left Arm)   Pulse (!) 56   Temp 98.9 F (37.2 C) (Oral)   Resp 16   Ht 5\' 6"  (1.676 m)   Wt 61.7 kg (136 lb)   LMP 07/14/2017   SpO2 99%   BMI 21.95 kg/m   Physical Exam  Constitutional: She is oriented to person, place, and time. She appears well-developed and well-nourished. No distress.  Nontoxic and in NAD  HENT:  Head: Normocephalic and atraumatic.  Posterior oropharyngeal erythema with mild edema.  Uvula midline.  Bilateral tonsillar enlargement with scant exudates.  No tripoding or stridor.  Tolerating secretions without difficulty.  Eyes: Conjunctivae and EOM are normal. No scleral icterus.  Neck: Normal range of motion.  Pulmonary/Chest: Effort normal. No stridor. No respiratory distress.  Respirations even and unlabored  Musculoskeletal: Normal range of motion.    Lymphadenopathy:    She has cervical adenopathy (bilateral tonsillar, tender).  Neurological: She is alert and oriented to person, place, and time. She exhibits normal muscle tone. Coordination normal.  Skin: Skin is warm and dry. No rash noted. She is not diaphoretic. No erythema. No pallor.  Psychiatric: She has a normal mood and affect. Her behavior is normal.  Nursing note and vitals reviewed.    ED Treatments / Results  Labs (all labs ordered are listed, but only abnormal results are displayed) Labs Reviewed - No data to display  EKG None  Radiology No results found.  Procedures Procedures (including critical care time)  Medications Ordered in ED Medications  dexamethasone (DECADRON) injection 10 mg (10 mg Intramuscular Given 07/20/17 0249)  ketorolac (TORADOL) injection 60 mg (60 mg Intramuscular Given 07/20/17 0248)  gi cocktail (Maalox,Lidocaine,Donnatal) (30 mLs Oral Given 07/20/17 0247)  azithromycin (ZITHROMAX) tablet 500 mg (500 mg Oral Given 07/20/17 0301)    3:00 AM Centor Score 4 c/w 51-53% likelihood of strep throat.    Initial Impression / Assessment and Plan / ED Course  I have reviewed the triage vital signs and the nursing notes.  Pertinent labs & imaging results that were available during my care of the patient were reviewed by me and considered in my medical decision making (see chart for details).     32 year old female presenting for sore throat.  She has an erythematous posterior oropharynx with dysphasia.  Associated fever as well as cervical lymphadenopathy.  Plan for treatment for strep throat.  No difficulty tolerating secretions today.  She will have the patient follow-up with her primary care doctor.  Return precautions discussed and provided. Patient discharged in stable condition with no unaddressed concerns.   Final Clinical Impressions(s) / ED Diagnoses   Final diagnoses:  Pharyngitis, unspecified etiology    ED Discharge Orders         Ordered    azithromycin (ZITHROMAX) 250 MG tablet  Daily     07/20/17 0254    phenol (CHLORASEPTIC) 1.4 % LIQD  As needed     07/20/17 0254    ibuprofen (ADVIL,MOTRIN) 600 MG tablet  Every 6 hours PRN     07/20/17 0254       Antony Madura, PA-C 07/20/17 0430    Palumbo, April, MD 07/20/17 [phone]

## 2017-10-14 ENCOUNTER — Other Ambulatory Visit: Payer: Self-pay

## 2017-10-20 ENCOUNTER — Encounter: Payer: Self-pay | Admitting: Internal Medicine

## 2018-03-11 ENCOUNTER — Encounter (HOSPITAL_COMMUNITY): Payer: Self-pay | Admitting: Emergency Medicine

## 2018-03-11 ENCOUNTER — Other Ambulatory Visit: Payer: Self-pay

## 2018-03-11 ENCOUNTER — Emergency Department (HOSPITAL_COMMUNITY)
Admission: EM | Admit: 2018-03-11 | Discharge: 2018-03-11 | Disposition: A | Discharge status: Home / Self Care | Payer: Self-pay | Attending: Emergency Medicine | Admitting: Emergency Medicine

## 2018-03-11 ENCOUNTER — Emergency Department (HOSPITAL_COMMUNITY): Payer: Self-pay

## 2018-03-11 DIAGNOSIS — R51 Headache: Secondary | ICD-10-CM | POA: Insufficient documentation

## 2018-03-11 DIAGNOSIS — R519 Headache, unspecified: Secondary | ICD-10-CM

## 2018-03-11 DIAGNOSIS — Z79899 Other long term (current) drug therapy: Secondary | ICD-10-CM | POA: Insufficient documentation

## 2018-03-11 DIAGNOSIS — Z87891 Personal history of nicotine dependence: Secondary | ICD-10-CM | POA: Insufficient documentation

## 2018-03-11 LAB — HCG, SERUM, QUALITATIVE: Preg, Serum: NEGATIVE

## 2018-03-11 MED ORDER — PROCHLORPERAZINE EDISYLATE 10 MG/2ML IJ SOLN
10.0000 mg | Freq: Once | INTRAMUSCULAR | Status: AC
  Administered 2018-03-11: 10 mg via INTRAVENOUS
  Filled 2018-03-11: qty 2

## 2018-03-11 MED ORDER — DIPHENHYDRAMINE HCL 50 MG/ML IJ SOLN
25.0000 mg | Freq: Once | INTRAMUSCULAR | Status: AC
  Administered 2018-03-11: 25 mg via INTRAVENOUS
  Filled 2018-03-11: qty 1

## 2018-03-11 MED ORDER — KETOROLAC TROMETHAMINE 15 MG/ML IJ SOLN
15.0000 mg | Freq: Once | INTRAMUSCULAR | Status: AC
  Administered 2018-03-11: 15 mg via INTRAVENOUS
  Filled 2018-03-11: qty 1

## 2018-03-11 MED ORDER — SODIUM CHLORIDE 0.9 % IV BOLUS
1000.0000 mL | Freq: Once | INTRAVENOUS | Status: AC
  Administered 2018-03-11: 1000 mL via INTRAVENOUS

## 2018-03-11 NOTE — ED Notes (Signed)
Patient transported to CT 

## 2018-03-11 NOTE — ED Provider Notes (Signed)
[facility]-EMERG[facility] DEPT Provider Note   CSN: [id number] Arrival date & time: 03/11/18  1956     History   Chief Complaint Chief Complaint  Patient presents with  . Headache    HPI Melissa Vang is a 32 y.o. female.  32 y.o female with a PMH of Depression, Panic attacks presents to the ED with a chief complaint of headache x 1 day.Patient describes this severe sudden headache with blurry vision and nausea which began this evening. She reports the pain was sharp radiating from the back of her neck to the front of her right orbital region. She states the pain was constant but has improved some. She describes her whole vision going blurry only on the right eye. She has not tried any therapy from relieve. Patient has a previous history of bad headaches but has not been diagnosed with migraines. She has not seen a primary physician for her headaches. Endorses photophobia no phonophobia.She denies any vomiting, dizziness, shortness of breath or chest pain.      Past Medical History:  Diagnosis Date  . Depression    Currently following with [facility] 04/2017  . Panic attack    04/2017  currently followed at [facility]    Patient Active Problem List   Diagnosis Date Noted  . Depression   . Panic attack     Past Surgical History:  Procedure Laterality Date  . MOUTH SURGERY    . MOUTH SURGERY  age 23   for impacted tooth in hard palate     OB History   None      Home Medications    Prior to Admission medications   Medication Sig Start Date End Date Taking? Authorizing Provider  diphenhydrAMINE (BENADRYL) 25 MG tablet Take 25 mg by mouth every 6 (six) hours as needed (breakout).   Yes [provider]  ibuprofen (ADVIL,MOTRIN) 200 MG tablet Take 600 mg by mouth. Every 4 to 6 hours as needed for pain   Yes [provider]  azithromycin (ZITHROMAX) 250 MG tablet Take 1 tablet (250 mg total) by mouth daily. Take 1 every day until  finished. Patient not taking: Reported on 03/11/2018 07/20/17   Antony Madura, PA-C  fexofenadine (ALLEGRA) 180 MG tablet Take 1 tablet (180 mg total) by mouth daily. Patient not taking: Reported on 03/11/2018 07/16/17   Julieanne Manson, MD  fluticasone [facility]) 50 MCG/ACT nasal spray Place 2 sprays into both nostrils daily. Patient not taking: Reported on 03/11/2018 07/16/17   Julieanne Manson, MD  ibuprofen (ADVIL,MOTRIN) 600 MG tablet Take 1 tablet (600 mg total) by mouth every 6 (six) hours as needed. Patient not taking: Reported on 03/11/2018 07/20/17   Antony Madura, PA-C  phenol (CHLORASEPTIC) 1.4 % LIQD Use as directed 1 spray in the mouth or throat as needed for throat irritation / pain. Patient not taking: Reported on 03/11/2018 07/20/17   Antony Madura, PA-C    Family History Family History  Problem Relation Age of Onset  . Diabetes Mother   . Arthritis Mother        disabled due to multiple joint and spine issues  . Cancer Mother        "precancer of ovaries"  . Diabetes Father     Social History Social History   Tobacco Use  . Smoking status: Former Smoker    Packs/day: 0.25    Years: 18.00    Pack years: 4.50    Types: Cigarettes  . Smokeless tobacco: Never Used  Substance Use Topics  . Alcohol use: Yes    Comment: 1-2 drinks per week  . Drug use: No     Allergies   Amoxil [amoxicillin] and Penicillins   Review of Systems Review of Systems  Constitutional: Negative for fever.  HENT: Negative for sore throat.   Eyes: Positive for photophobia and visual disturbance.  Respiratory: Negative for shortness of breath.   Cardiovascular: Negative for chest pain.  Gastrointestinal: Positive for nausea. Negative for abdominal pain, diarrhea and vomiting.  Genitourinary: Negative for dysuria and flank pain.  Musculoskeletal: Negative for back pain.  Skin: Negative for pallor and wound.  Neurological: Positive for headaches.     Physical Exam Updated Vital  Signs BP 109/70   Pulse 70   Temp (!) 97.5 F (36.4 C) (Oral)   Resp 17   Ht 5\' 4"  (1.626 m)   Wt 68 kg   SpO2 97%   BMI 25.75 kg/m   Physical Exam  Constitutional: She is oriented to person, place, and time. She appears well-developed and well-nourished. No distress.  HENT:  Head: Normocephalic and atraumatic.  Mouth/Throat: Oropharynx is clear and moist. No oropharyngeal exudate.  Eyes: Pupils are equal, round, and reactive to light.  Neck: Normal range of motion.  Cardiovascular: Regular rhythm and normal heart sounds.  Pulmonary/Chest: Effort normal and breath sounds normal. No respiratory distress.  Abdominal: Soft. Bowel sounds are normal. She exhibits no distension. There is no tenderness.  Musculoskeletal: She exhibits no tenderness or deformity.       Right lower leg: She exhibits no edema.       Left lower leg: She exhibits no edema.  Neurological: She is alert and oriented to person, place, and time.  Alert, oriented, thought content appropriate. Speech fluent without evidence of aphasia. Able to follow 2 step commands without difficulty.  Cranial Nerves:  II:  Peripheral visual fields grossly normal, pupils, round, reactive to light III,IV, VI: ptosis not present, extra-ocular motions intact bilaterally  V,VII: smile symmetric, facial light touch sensation equal VIII: hearing grossly normal bilaterally  IX,X: midline uvula rise  XI: bilateral shoulder shrug equal and strong XII: midline tongue extension  Motor:  5/5 in upper and lower extremities bilaterally including strong and equal grip strength and dorsiflexion/plantar flexion Sensory: light touch normal in all extremities.  Cerebellar: normal finger-to-nose with bilateral upper extremities, pronator drift negative Gait: normal gait and balance   Skin: Skin is warm and dry.  Psychiatric: She has a normal mood and affect.  Nursing note and vitals reviewed.    ED Treatments / Results  Labs (all labs  ordered are listed, but only abnormal results are displayed) Labs Reviewed  HCG, SERUM, QUALITATIVE    EKG None  Radiology Ct Head Wo Contrast  Result Date: 03/11/2018 CLINICAL DATA:  Headache EXAM: CT HEAD WITHOUT CONTRAST TECHNIQUE: Contiguous axial images were obtained from the base of the skull through the vertex without intravenous contrast. COMPARISON:  None. FINDINGS: Brain: No evidence of acute infarction, hemorrhage, hydrocephalus, extra-axial collection or mass lesion/mass effect. Vascular: No hyperdense vessel or unexpected calcification. Skull: Normal. Negative for fracture or focal lesion. Sinuses/Orbits: No acute finding. Other: None IMPRESSION: Negative non contrasted CT appearance of the brain Electronically Signed   By: Jasmine PangKim  Fujinaga M.D.   On: 03/11/2018 22:03    Procedures Procedures (including critical care time)  Medications Ordered in ED Medications  sodium chloride 0.9 % bolus 1,000 mL (1,000 mLs Intravenous New Bag/Given 03/11/18 2147)  ketorolac (  TORADOL) 15 MG/ML injection 15 mg (15 mg Intravenous Given 03/11/18 2139)  diphenhydrAMINE (BENADRYL) injection 25 mg (25 mg Intravenous Given 03/11/18 2145)  prochlorperazine (COMPAZINE) injection 10 mg (10 mg Intravenous Given 03/11/18 2140)     Initial Impression / Assessment and Plan / ED Course  I have reviewed the triage vital signs and the nursing notes.  Pertinent labs & imaging results that were available during my care of the patient were reviewed by me and considered in my medical decision making (see chart for details).    Patient presents to the ED with sudden onset headache with sharp pain and blurry vision. Has a previous history of bad headaches but has not been placed on medication for pain control. Patient also reports some nausea with this episode.Will obtain CT head to r/o any subarachnoid hemorrhage or acute abnormality. Will provide patient with HA cocktail for symptomatic relieve.  CT head  showed no acute abnormality.  She reports symptomatic relieve with head cocktail. Will discharge after bolus with referral to neurology for further evaluation for her headaches. Patient understands and agrees with plan. Return precautions provided.   Final Clinical Impressions(s) / ED Diagnoses   Final diagnoses:  Bad headache    ED Discharge Orders    None       Claude Manges, PA-C 03/11/18 2234    Pricilla Loveless, MD 03/11/18 2351

## 2018-03-11 NOTE — ED Triage Notes (Signed)
Pt presents with migraine symptoms. Patient states she had a sharp pain behind her right ear, which she has experienced over the past few years. Patient states tonight the pain radiated around her head into her neck and temple. Patient states she had blurry vision that subsided with associated photosensitivity.

## 2018-03-11 NOTE — Discharge Instructions (Addendum)
The CT of your head today was normal. I have provided a referral to a neurologist to further evaluate your headaches and obtain better management, please schedule an appointment as needed. If you experience any worsening symptoms your may return to the ED for reevaluation.

## 2018-06-16 ENCOUNTER — Encounter (HOSPITAL_COMMUNITY): Payer: Self-pay | Admitting: Emergency Medicine

## 2018-06-16 ENCOUNTER — Emergency Department (HOSPITAL_COMMUNITY)
Admission: EM | Admit: 2018-06-16 | Discharge: 2018-06-16 | Disposition: A | Discharge status: Home / Self Care | Payer: Self-pay | Attending: Emergency Medicine | Admitting: Emergency Medicine

## 2018-06-16 ENCOUNTER — Other Ambulatory Visit: Payer: Self-pay

## 2018-06-16 DIAGNOSIS — B349 Viral infection, unspecified: Secondary | ICD-10-CM | POA: Insufficient documentation

## 2018-06-16 DIAGNOSIS — J111 Influenza due to unidentified influenza virus with other respiratory manifestations: Secondary | ICD-10-CM | POA: Insufficient documentation

## 2018-06-16 DIAGNOSIS — R5381 Other malaise: Secondary | ICD-10-CM | POA: Insufficient documentation

## 2018-06-16 DIAGNOSIS — R0981 Nasal congestion: Secondary | ICD-10-CM | POA: Insufficient documentation

## 2018-06-16 DIAGNOSIS — Z87891 Personal history of nicotine dependence: Secondary | ICD-10-CM | POA: Insufficient documentation

## 2018-06-16 DIAGNOSIS — R5383 Other fatigue: Secondary | ICD-10-CM | POA: Insufficient documentation

## 2018-06-16 LAB — PREGNANCY, URINE: PREG TEST UR: NEGATIVE

## 2018-06-16 LAB — INFLUENZA PANEL BY PCR (TYPE A & B)
INFLBPCR: NEGATIVE
Influenza A By PCR: POSITIVE — AB

## 2018-06-16 MED ORDER — IBUPROFEN 200 MG PO TABS
600.0000 mg | ORAL_TABLET | Freq: Once | ORAL | Status: AC
  Administered 2018-06-16: 600 mg via ORAL
  Filled 2018-06-16: qty 3

## 2018-06-16 MED ORDER — ACETAMINOPHEN 500 MG PO TABS
1000.0000 mg | ORAL_TABLET | Freq: Once | ORAL | Status: AC
  Administered 2018-06-16: 1000 mg via ORAL
  Filled 2018-06-16: qty 2

## 2018-06-16 NOTE — ED Triage Notes (Signed)
Per pt, states cold and flu like symptoms for 5 days-congestion, body aches-does not have a primary care MD

## 2018-06-16 NOTE — ED Provider Notes (Signed)
[facility]-EMERGENCY DEPT Provider Note   CSN: [id number] Arrival date & time: 06/16/18  2585    History   Chief Complaint Chief Complaint  Patient presents with  . URI    HPI Melissa Vang is a 33 y.o. female.     33 year old female with prior medical history as detailed below presents for evaluation of URI symptoms.  Patient reports 4 to 5 days of cough, congestion, malaise, fatigue, body aches, and mild cough. She denies nausea, vomiting, or diarrhea. She reports known sick contacts with similar symptoms. No recent travel history reported.   She is unsure as to her pregnancy status and requests a pregnancy test.   She is unsure if she got the flu shot this year.   The history is provided by the patient and medical records.  URI  Presenting symptoms: congestion, cough, fatigue, fever and rhinorrhea   Congestion:    Location:  Nasal Severity:  Mild Onset quality:  Gradual Duration:  5 days Timing:  Constant Progression:  Waxing and waning Chronicity:  New Relieved by:  Nothing Worsened by:  Nothing Ineffective treatments:  Decongestant Associated symptoms: no headaches     Past Medical History:  Diagnosis Date  . Depression    Currently following with [facility] 04/2017  . Panic attack    04/2017  currently followed at [facility]    Patient Active Problem List   Diagnosis Date Noted  . Depression   . Panic attack     Past Surgical History:  Procedure Laterality Date  . MOUTH SURGERY    . MOUTH SURGERY  age 14   for impacted tooth in hard palate     OB History   No obstetric history on file.      Home Medications    Prior to Admission medications   Medication Sig Start Date End Date Taking? Authorizing Provider  azithromycin (ZITHROMAX) 250 MG tablet Take 1 tablet (250 mg total) by mouth daily. Take 1 every day until finished. Patient not taking: Reported on 03/11/2018 07/20/17   Antony Madura, PA-C  diphenhydrAMINE (BENADRYL) 25  MG tablet Take 25 mg by mouth every 6 (six) hours as needed (breakout).    [provider]  fexofenadine (ALLEGRA) 180 MG tablet Take 1 tablet (180 mg total) by mouth daily. Patient not taking: Reported on 03/11/2018 07/16/17   Julieanne Manson, MD  fluticasone [facility]) 50 MCG/ACT nasal spray Place 2 sprays into both nostrils daily. Patient not taking: Reported on 03/11/2018 07/16/17   Julieanne Manson, MD  ibuprofen (ADVIL,MOTRIN) 200 MG tablet Take 600 mg by mouth. Every 4 to 6 hours as needed for pain    [provider]  ibuprofen (ADVIL,MOTRIN) 600 MG tablet Take 1 tablet (600 mg total) by mouth every 6 (six) hours as needed. Patient not taking: Reported on 03/11/2018 07/20/17   Antony Madura, PA-C  phenol (CHLORASEPTIC) 1.4 % LIQD Use as directed 1 spray in the mouth or throat as needed for throat irritation / pain. Patient not taking: Reported on 03/11/2018 07/20/17   Antony Madura, PA-C    Family History Family History  Problem Relation Age of Onset  . Diabetes Mother   . Arthritis Mother        disabled due to multiple joint and spine issues  . Cancer Mother        "precancer of ovaries"  . Diabetes Father     Social History Social History   Tobacco Use  . Smoking status: Former Smoker  Packs/day: 0.25    Years: 18.00    Pack years: 4.50    Types: Cigarettes  . Smokeless tobacco: Never Used  Substance Use Topics  . Alcohol use: Yes    Comment: 1-2 drinks per week  . Drug use: No     Allergies   Amoxil [amoxicillin] and Penicillins   Review of Systems Review of Systems  Constitutional: Positive for fatigue and fever.  HENT: Positive for congestion and rhinorrhea.   Respiratory: Positive for cough.   Neurological: Negative for headaches.  All other systems reviewed and are negative.    Physical Exam Updated Vital Signs BP 124/81 (BP Location: Right Arm)   Pulse 93   Temp 98.5 F (36.9 C)   Resp 18   SpO2 100%   Physical  Exam Vitals signs and nursing note reviewed.  Constitutional:      General: She is not in acute distress.    Appearance: Normal appearance. She is well-developed.  HENT:     Head: Normocephalic and atraumatic.  Eyes:     Conjunctiva/sclera: Conjunctivae normal.     Pupils: Pupils are equal, round, and reactive to light.  Neck:     Musculoskeletal: Normal range of motion and neck supple.  Cardiovascular:     Rate and Rhythm: Normal rate and regular rhythm.     Heart sounds: Normal heart sounds.  Pulmonary:     Effort: Pulmonary effort is normal. No respiratory distress.     Breath sounds: Normal breath sounds.  Abdominal:     General: There is no distension.     Palpations: Abdomen is soft.     Tenderness: There is no abdominal tenderness.  Musculoskeletal: Normal range of motion.        General: No deformity.  Skin:    General: Skin is warm and dry.  Neurological:     General: No focal deficit present.     Mental Status: She is alert and oriented to person, place, and time. Mental status is at baseline.     Cranial Nerves: No cranial nerve deficit.     Sensory: No sensory deficit.     Motor: No weakness.     Coordination: Coordination normal.      ED Treatments / Results  Labs (all labs ordered are listed, but only abnormal results are displayed) Labs Reviewed  INFLUENZA PANEL BY PCR (TYPE A & B) - Abnormal; Notable for the following components:      Result Value   Influenza A By PCR POSITIVE (*)    All other components within normal limits  PREGNANCY, URINE    EKG None  Radiology No results found.  Procedures Procedures (including critical care time)  Medications Ordered in ED Medications  acetaminophen (TYLENOL) tablet 1,000 mg (has no administration in time range)  ibuprofen (ADVIL,MOTRIN) tablet 600 mg (has no administration in time range)     Initial Impression / Assessment and Plan / ED Course  I have reviewed the triage vital signs and the  nursing notes.  Pertinent labs & imaging results that were available during my care of the patient were reviewed by me and considered in my medical decision making (see chart for details).        MDM  Screen complete  Patient is presenting for evaluation of symptoms consistent with likely influenza infection.  Patient did request influenza testing.  Patient is positive for flu.  She does understand plan for symptomatic treatment at home.  Patient is without other comorbidities  or indications for further treatment at this time.  Strict return precautions given and understood.  Importance of close follow-up is stressed.  Final Clinical Impressions(s) / ED Diagnoses   Final diagnoses:  Viral syndrome  Influenza    ED Discharge Orders    None       Wynetta Fines, MD 06/16/18 [phone]

## 2018-06-16 NOTE — Discharge Instructions (Addendum)
Return for any problem.  Follow-up with your regular care provider as instructed.  Drink plenty of fluids.  

## 2019-02-16 ENCOUNTER — Encounter (HOSPITAL_COMMUNITY): Payer: Self-pay | Admitting: Emergency Medicine

## 2019-02-16 ENCOUNTER — Emergency Department (HOSPITAL_COMMUNITY)
Admission: EM | Admit: 2019-02-16 | Discharge: 2019-02-16 | Disposition: A | Discharge status: Home / Self Care | Payer: Self-pay | Attending: Emergency Medicine | Admitting: Emergency Medicine

## 2019-02-16 ENCOUNTER — Other Ambulatory Visit: Payer: Self-pay

## 2019-02-16 DIAGNOSIS — B9689 Other specified bacterial agents as the cause of diseases classified elsewhere: Secondary | ICD-10-CM

## 2019-02-16 DIAGNOSIS — N898 Other specified noninflammatory disorders of vagina: Secondary | ICD-10-CM | POA: Insufficient documentation

## 2019-02-16 DIAGNOSIS — N760 Acute vaginitis: Secondary | ICD-10-CM | POA: Insufficient documentation

## 2019-02-16 DIAGNOSIS — K2970 Gastritis, unspecified, without bleeding: Secondary | ICD-10-CM | POA: Insufficient documentation

## 2019-02-16 DIAGNOSIS — Z87891 Personal history of nicotine dependence: Secondary | ICD-10-CM | POA: Insufficient documentation

## 2019-02-16 LAB — I-STAT BETA HCG BLOOD, ED (MC, WL, AP ONLY): I-stat hCG, quantitative: <5 m[IU]/mL (ref <5)

## 2019-02-16 LAB — COMPREHENSIVE METABOLIC PANEL
ALT: 13 U/L (ref 0–44)
AST: 19 U/L (ref 15–41)
Albumin: 4.7 g/dL (ref 3.5–5.0)
Alkaline Phosphatase: 11 U/L — ABNORMAL LOW (ref 38–126)
Anion gap: 10 (ref 5–15)
BUN: 14 mg/dL (ref 6–20)
CO2: 24 mmol/L (ref 22–32)
Calcium: 9.5 mg/dL (ref 8.9–10.3)
Chloride: 105 mmol/L (ref 98–111)
Creatinine, Ser: 1.23 mg/dL — ABNORMAL HIGH (ref 0.44–1.00)
GFR calc Af Amer: >60 mL/min (ref >60)
GFR calc non Af Amer: 58 mL/min — ABNORMAL LOW (ref >60)
Glucose, Bld: 100 mg/dL — ABNORMAL HIGH (ref 70–99)
Potassium: 3.8 mmol/L (ref 3.5–5.1)
Sodium: 139 mmol/L (ref 135–145)
Total Bilirubin: 1.4 mg/dL — ABNORMAL HIGH (ref 0.3–1.2)
Total Protein: 7.5 g/dL (ref 6.5–8.1)

## 2019-02-16 LAB — CBC
HCT: 45.4 % (ref 36.0–46.0)
Hemoglobin: 14.9 g/dL (ref 12.0–15.0)
MCH: 31.2 pg (ref 26.0–34.0)
MCHC: 32.8 g/dL (ref 30.0–36.0)
MCV: 95 fL (ref 80.0–100.0)
Platelets: 215 10*3/uL (ref 150–400)
RBC: 4.78 MIL/uL (ref 3.87–5.11)
RDW: 12.3 % (ref 11.5–15.5)
WBC: 6.4 10*3/uL (ref 4.0–10.5)
nRBC: 0 % (ref 0.0–0.2)

## 2019-02-16 LAB — URINALYSIS, ROUTINE W REFLEX MICROSCOPIC
Bilirubin Urine: NEGATIVE
Glucose, UA: NEGATIVE mg/dL
Hgb urine dipstick: NEGATIVE
Ketones, ur: 20 mg/dL — AB
Leukocytes,Ua: NEGATIVE
Nitrite: NEGATIVE
Protein, ur: NEGATIVE mg/dL
Specific Gravity, Urine: 1.012 (ref 1.005–1.030)
pH: 6 (ref 5.0–8.0)

## 2019-02-16 LAB — WET PREP, GENITAL
Sperm: NONE SEEN
Trich, Wet Prep: NONE SEEN
Yeast Wet Prep HPF POC: NONE SEEN

## 2019-02-16 LAB — LIPASE, BLOOD: Lipase: 40 U/L (ref 11–51)

## 2019-02-16 MED ORDER — SODIUM CHLORIDE 0.9 % IV BOLUS
1000.0000 mL | Freq: Once | INTRAVENOUS | Status: AC
  Administered 2019-02-16: 18:00:00 1000 mL via INTRAVENOUS

## 2019-02-16 MED ORDER — ONDANSETRON HCL 4 MG/2ML IJ SOLN
4.0000 mg | Freq: Once | INTRAMUSCULAR | Status: AC
  Administered 2019-02-16: 18:00:00 4 mg via INTRAVENOUS
  Filled 2019-02-16: qty 2

## 2019-02-16 MED ORDER — METRONIDAZOLE 500 MG PO TABS: 500.0000 mg | ORAL_TABLET | Freq: Two times a day (BID) | ORAL | 0 refills | Status: AC

## 2019-02-16 MED ORDER — ACETAMINOPHEN 500 MG PO TABS
500.0000 mg | ORAL_TABLET | Freq: Once | ORAL | Status: AC
  Administered 2019-02-16: 22:00:00 500 mg via ORAL
  Filled 2019-02-16: qty 1

## 2019-02-16 MED ORDER — ONDANSETRON HCL 4 MG PO TABS: 4.0000 mg | ORAL_TABLET | Freq: Four times a day (QID) | ORAL | 0 refills | Status: DC

## 2019-02-16 NOTE — Discharge Instructions (Addendum)
If you develop any abdominal pain, fever, please return to the ER for reassessment.  If you feel that your symptoms have not resolved within 48 hours, also recommend return to ER for recheck.  Please take the antibiotic as prescribed for the bacterial vaginosis. Take zofran as needed for further episodes of nausea.

## 2019-02-16 NOTE — ED Triage Notes (Signed)
Per [facility] pt from home for nausea x week and vomiting today. Has low HR. CBg 122. Works at hotel.  Last Bm today, but was small. repotrs not eating much, "then what I do eat is not good". Reports last menstrual cycle was mid October and sexually active without protection.

## 2019-02-16 NOTE — ED Provider Notes (Signed)
[facility]-EMERGENCY DEPT Provider Note   CSN: [id number] Arrival date & time: 02/16/19  1227     History   Chief Complaint Chief Complaint  Patient presents with  . Emesis    HPI Melissa Vang is a 33 y.o. female.  Presents to ER with nausea, emesis for the past couple weeks.  Patient states symptoms started approximately 2 weeks ago.  Has felt nauseous intermittently.  Has intermittently had vomiting episodes.  Today had an episode this morning.  Nonbloody nonbilious.  Had been having constipation but then had an episode of loose stool today.  No blood.  She denies having any abdominal pain associated with her symptoms.  No fever.  No dysuria or hematuria.  Has noted and some vaginal discharge.  States that she intermittently has yellow to white vaginal discharge, states over the past week it has been more foul-smelling than normal.  Denies prior history of STD.     HPI  Past Medical History:  Diagnosis Date  . Depression    Currently following with [facility] 04/2017  . Panic attack    04/2017  currently followed at [facility]    Patient Active Problem List   Diagnosis Date Noted  . Depression   . Panic attack     Past Surgical History:  Procedure Laterality Date  . MOUTH SURGERY    . MOUTH SURGERY  age 67   for impacted tooth in hard palate     OB History   No obstetric history on file.      Home Medications    Prior to Admission medications   Medication Sig Start Date End Date Taking? Authorizing Provider  azithromycin (ZITHROMAX) 250 MG tablet Take 1 tablet (250 mg total) by mouth daily. Take 1 every day until finished. Patient not taking: Reported on 03/11/2018 07/20/17   Antony Madura, PA-C  dextromethorphan-guaiFENesin [facility] DM) 30-600 MG 12hr tablet Take 1 tablet by mouth 2 (two) times daily as needed for cough.    [provider]  fexofenadine (ALLEGRA) 180 MG tablet Take 1 tablet (180 mg total) by mouth daily. Patient not  taking: Reported on 03/11/2018 07/16/17   Julieanne Manson, MD  fluticasone [facility]) 50 MCG/ACT nasal spray Place 2 sprays into both nostrils daily. Patient not taking: Reported on 03/11/2018 07/16/17   Julieanne Manson, MD  ibuprofen (ADVIL,MOTRIN) 600 MG tablet Take 1 tablet (600 mg total) by mouth every 6 (six) hours as needed. Patient not taking: Reported on 03/11/2018 07/20/17   Antony Madura, PA-C  ibuprofen (ADVIL,MOTRIN) 800 MG tablet Take 800 mg by mouth every 8 (eight) hours as needed for headache or moderate pain.    [provider]  phenol (CHLORASEPTIC) 1.4 % LIQD Use as directed 1 spray in the mouth or throat as needed for throat irritation / pain. Patient not taking: Reported on 03/11/2018 07/20/17   Antony Madura, PA-C  phenylephrine (SUDAFED PE) 10 MG TABS tablet Take 10 mg by mouth every 4 (four) hours as needed (congestion).    [provider]    Family History Family History  Problem Relation Age of Onset  . Diabetes Mother   . Arthritis Mother        disabled due to multiple joint and spine issues  . Cancer Mother        "precancer of ovaries"  . Diabetes Father     Social History Social History   Tobacco Use  . Smoking status: Former Smoker    Packs/day: 0.25  Years: 18.00    Pack years: 4.50    Types: Cigarettes  . Smokeless tobacco: Never Used  Substance Use Topics  . Alcohol use: Yes    Comment: 1-2 drinks per week  . Drug use: No     Allergies   Amoxil [amoxicillin] and Penicillins   Review of Systems Review of Systems  Constitutional: Negative for chills and fever.  HENT: Negative for ear pain and sore throat.   Eyes: Negative for pain and visual disturbance.  Respiratory: Negative for cough and shortness of breath.   Cardiovascular: Negative for chest pain and palpitations.  Gastrointestinal: Positive for vomiting. Negative for abdominal pain.  Genitourinary: Negative for dysuria and hematuria.  Musculoskeletal: Negative  for arthralgias and back pain.  Skin: Negative for color change and rash.  Neurological: Negative for seizures and syncope.  All other systems reviewed and are negative.    Physical Exam Updated Vital Signs BP (!) 141/73   Pulse (!) 58   Temp 98.1 F (36.7 C) (Oral)   Resp 16   LMP 01/28/2019   SpO2 100%   Physical Exam Vitals signs and nursing note reviewed. Exam conducted with a chaperone present.  Constitutional:      General: She is not in acute distress.    Appearance: She is well-developed.  HENT:     Head: Normocephalic and atraumatic.  Eyes:     Conjunctiva/sclera: Conjunctivae normal.  Neck:     Musculoskeletal: Neck supple.  Cardiovascular:     Rate and Rhythm: Normal rate and regular rhythm.     Heart sounds: No murmur.  Pulmonary:     Effort: Pulmonary effort is normal. No respiratory distress.     Breath sounds: Normal breath sounds.  Abdominal:     Palpations: Abdomen is soft.     Tenderness: There is no abdominal tenderness.  Genitourinary:    Labia:        Right: No rash.        Left: No rash.      Comments: Small white vaginal discharge noted, no cervical motion tenderness, no tenderness in right or left adnexa Musculoskeletal:        General: No swelling or tenderness.  Skin:    General: Skin is warm and dry.  Neurological:     General: No focal deficit present.     Mental Status: She is alert and oriented to person, place, and time.  Psychiatric:        Mood and Affect: Mood normal.      ED Treatments / Results  Labs (all labs ordered are listed, but only abnormal results are displayed) Labs Reviewed  COMPREHENSIVE METABOLIC PANEL - Abnormal; Notable for the following components:      Result Value   Glucose, Bld 100 (*)    Creatinine, Ser 1.23 (*)    Alkaline Phosphatase 11 (*)    Total Bilirubin 1.4 (*)    GFR calc non Af Amer 58 (*)    All other components within normal limits  URINALYSIS, ROUTINE W REFLEX MICROSCOPIC - Abnormal;  Notable for the following components:   Ketones, ur 20 (*)    All other components within normal limits  LIPASE, BLOOD  CBC  I-STAT BETA HCG BLOOD, ED ([facility], [facility], [facility] ONLY)    EKG None  Radiology No results found.  Procedures Procedures (including critical care time)  Medications Ordered in ED Medications  ondansetron (ZOFRAN) injection 4 mg (4 mg Intravenous Given 02/16/19 1819)  sodium chloride  0.9 % bolus 1,000 mL (1,000 mLs Intravenous New Bag/Given 02/16/19 1818)     Initial Impression / Assessment and Plan / ED Course  I have reviewed the triage vital signs and the nursing notes.  Pertinent labs & imaging results that were available during my care of the patient were reviewed by me and considered in my medical decision making (see chart for details).  Clinical Course as of Feb 15 2142  Tue Feb 16, 2019  2003 RN at bedside to eval to chaperone pelvic Lurena Joiner-Rebecca   [RD]  2142 Recheck patient, updated on results, remains well-appearing, repeat abdominal exam again soft nontender, ate a bowl of chicken noodle soup without difficulty, will dc home   [RD]    Clinical Course User Index [RD] Milagros Lollykstra, Damon Hargrove S, MD       33 year old presents to ER after having a couple weeks of intermittent nausea and vomiting.  Here patient is noted be very well-appearing, no abdominal pain by history, soft abdomen on exam.  Given exam, labs, very low suspicion for acute abdominal process.  On review of systems, patient did note worsening vaginal discharge.  Pelvic exam performed, noted small white vaginal discharge but otherwise normal.  Wet prep concerning for BV.  Also sent for GC chlamydia though patient is monogamous and have lower clinical suspicion.  Will defer treatment until this results.  Recommended recheck with her primary doctor or return to ER in 24 to 48 hours should she have any persistence of symptoms.  Strongly recommended a return to ER if she develops fever or abdominal pain.   After the discussed management above, the patient was determined to be safe for discharge.  The patient was in agreement with this plan and all questions regarding their care were answered.  ED return precautions were discussed and the patient will return to the ED with any significant worsening of condition.    Final Clinical Impressions(s) / ED Diagnoses   Final diagnoses:  Viral gastritis  Bacterial vaginosis    ED Discharge Orders    None       Milagros Lollykstra, Piper Hassebrock S, MD 02/16/19 2145

## 2019-02-16 NOTE — ED Notes (Signed)
Pelvic Cart set up and ready at bedside 

## 2019-02-16 NOTE — ED Notes (Signed)
URINE COLLECTED IN TRIAGE 

## 2019-02-17 LAB — HIV ANTIBODY (ROUTINE TESTING W REFLEX): HIV Screen 4th Generation wRfx: NONREACTIVE

## 2019-02-18 LAB — GC/CHLAMYDIA PROBE AMP (~~LOC~~) NOT AT ARMC
Chlamydia: NEGATIVE
Neisseria Gonorrhea: NEGATIVE

## 2019-03-10 ENCOUNTER — Encounter (HOSPITAL_COMMUNITY): Payer: Self-pay

## 2019-03-10 ENCOUNTER — Other Ambulatory Visit: Payer: Self-pay

## 2019-03-10 ENCOUNTER — Emergency Department (HOSPITAL_COMMUNITY)
Admission: EM | Admit: 2019-03-10 | Discharge: 2019-03-10 | Disposition: A | Discharge status: Home / Self Care | Payer: Self-pay | Attending: Emergency Medicine | Admitting: Emergency Medicine

## 2019-03-10 DIAGNOSIS — I809 Phlebitis and thrombophlebitis of unspecified site: Secondary | ICD-10-CM | POA: Insufficient documentation

## 2019-03-10 DIAGNOSIS — Z87891 Personal history of nicotine dependence: Secondary | ICD-10-CM | POA: Insufficient documentation

## 2019-03-10 DIAGNOSIS — Z79899 Other long term (current) drug therapy: Secondary | ICD-10-CM | POA: Insufficient documentation

## 2019-03-10 NOTE — ED Provider Notes (Signed)
[facility] DEPT Provider Note   CSN: [id number] Arrival date & time: 03/10/19  4403     History   Chief Complaint Chief Complaint  Patient presents with  . iv site pain    HPI Melissa Vang is a 33 y.o. female.     Patient with pain at left [facility] area where she had recent IV.  Pain when she flexes and extends at the elbow.  Has not tried anything for the pain except for heat.  Nothing makes it worse.  Started over the last several days.  Denies any fever, chills.  The history is provided by the patient.  Illness Severity:  Mild Onset quality:  Gradual Timing:  Constant Progression:  Unchanged Associated symptoms: no cough, no fever and no myalgias     Past Medical History:  Diagnosis Date  . Depression    Currently following with [facility] 04/2017  . Panic attack    04/2017  currently followed at [facility]    Patient Active Problem List   Diagnosis Date Noted  . Depression   . Panic attack     Past Surgical History:  Procedure Laterality Date  . MOUTH SURGERY    . MOUTH SURGERY  age 30   for impacted tooth in hard palate     OB History   No obstetric history on file.      Home Medications    Prior to Admission medications   Medication Sig Start Date End Date Taking? Authorizing Provider  Aspirin-Acetaminophen-Caffeine (GOODY HEADACHE PO) Take 1 packet by mouth as needed (pain/headache).   Yes [provider]  azithromycin (ZITHROMAX) 250 MG tablet Take 1 tablet (250 mg total) by mouth daily. Take 1 every day until finished. Patient not taking: Reported on 03/11/2018 07/20/17   Antonietta Breach, PA-C  fexofenadine (ALLEGRA) 180 MG tablet Take 1 tablet (180 mg total) by mouth daily. Patient not taking: Reported on 03/11/2018 07/16/17   Mack Hook, MD  fluticasone [facility]) 50 MCG/ACT nasal spray Place 2 sprays into both nostrils daily. Patient not taking: Reported on 03/10/2019 07/16/17   Mack Hook, MD   ibuprofen (ADVIL,MOTRIN) 600 MG tablet Take 1 tablet (600 mg total) by mouth every 6 (six) hours as needed. Patient not taking: Reported on 03/11/2018 07/20/17   Antonietta Breach, PA-C  ondansetron (ZOFRAN) 4 MG tablet Take 1 tablet (4 mg total) by mouth every 6 (six) hours. Patient not taking: Reported on 03/10/2019 02/16/19   Lucrezia Starch, MD  phenol (CHLORASEPTIC) 1.4 % LIQD Use as directed 1 spray in the mouth or throat as needed for throat irritation / pain. Patient not taking: Reported on 03/11/2018 07/20/17   Antonietta Breach, PA-C    Family History Family History  Problem Relation Age of Onset  . Diabetes Mother   . Arthritis Mother        disabled due to multiple joint and spine issues  . Cancer Mother        "precancer of ovaries"  . Diabetes Father     Social History Social History   Tobacco Use  . Smoking status: Former Smoker    Packs/day: 0.25    Years: 18.00    Pack years: 4.50    Types: Cigarettes  . Smokeless tobacco: Never Used  Substance Use Topics  . Alcohol use: Yes    Comment: 1-2 drinks per week  . Drug use: No     Allergies   Amoxil [amoxicillin] and Penicillins   Review of  Systems Review of Systems  Constitutional: Negative for fever.  Respiratory: Negative for cough.   Musculoskeletal: Negative for arthralgias, back pain, gait problem, joint swelling, myalgias, neck pain and neck stiffness.  Skin: Positive for color change.  Neurological: Negative for weakness and numbness.     Physical Exam Updated Vital Signs BP 119/76 (BP Location: Right Arm)   Pulse 72   Temp 98.1 F (36.7 C) (Oral)   Resp 16   LMP 02/19/2019 (Approximate)   SpO2 99%   Physical Exam Constitutional:      General: She is not in acute distress.    Appearance: She is not ill-appearing.  Neck:     Musculoskeletal: Normal range of motion.  Cardiovascular:     Pulses: Normal pulses.  Skin:    General: Skin is warm.     Comments: Mild area of superficial redness in  the left AC along superficial vein  Neurological:     General: No focal deficit present.     Mental Status: She is alert.     Sensory: No sensory deficit.     Motor: No weakness.      ED Treatments / Results  Labs (all labs ordered are listed, but only abnormal results are displayed) Labs Reviewed - No data to display  EKG None  Radiology No results found.  Procedures Procedures (including critical care time)  Medications Ordered in ED Medications - No data to display   Initial Impression / Assessment and Plan / ED Course  I have reviewed the triage vital signs and the nursing notes.  Pertinent labs & imaging results that were available during my care of the patient were reviewed by me and considered in my medical decision making (see chart for details).        Melissa Vang is a 33 year old female with no significant medical history presents to the ED with pain at IV site in the left [facility].  Patient had recent IV in this area.  Normal vitals.  No fever.  Appears to have a superficial thrombophlebitis.  No signs to suggest septic joint, no signs to suggest cellulitis.  Good pulses in the upper extremity.  No concern for DVT.  Recommend Motrin, ice.  Given return precautions and discharged from the ED in good condition.  This chart was dictated using voice recognition software.  Despite best efforts to proofread,  errors can occur which can change the documentation meaning.    Final Clinical Impressions(s) / ED Diagnoses   Final diagnoses:  Thrombophlebitis    ED Discharge Orders    None       Virgina Norfolk, DO 03/10/19 1245

## 2019-03-10 NOTE — Discharge Instructions (Addendum)
Use 600 mg of Motrin every 8 hours for the next several days and then as needed.  Ice the area as well.  Follow-up with your primary care doctor if pain persist.

## 2019-03-10 NOTE — ED Triage Notes (Addendum)
Pt coming in c/o pain at former IV site. IV was placed 02/16/19. Site initially bruised but tightness didn't start until 2 days ago. Able to move and bend extremity freely. Pain when hyperextended or pressing on it

## 2019-04-13 ENCOUNTER — Ambulatory Visit: Payer: HRSA Program | Attending: Internal Medicine

## 2019-04-13 DIAGNOSIS — Z20822 Contact with and (suspected) exposure to covid-19: Secondary | ICD-10-CM

## 2019-04-13 DIAGNOSIS — Z20828 Contact with and (suspected) exposure to other viral communicable diseases: Secondary | ICD-10-CM | POA: Diagnosis present

## 2019-04-13 DIAGNOSIS — U071 COVID-19: Secondary | ICD-10-CM | POA: Diagnosis not present

## 2019-04-14 LAB — NOVEL CORONAVIRUS, NAA: SARS-CoV-2, NAA: DETECTED — AB

## 2019-04-15 ENCOUNTER — Encounter: Payer: Self-pay | Admitting: *Deleted

## 2019-04-22 ENCOUNTER — Ambulatory Visit: Payer: Self-pay | Admitting: *Deleted

## 2019-04-22 NOTE — Telephone Encounter (Signed)
Pt initially called due to difficulty scheduling covid test. Stated she was able to do so in interim. Advised to Cb for any additional questions  Answer Assessment - Initial Assessment Questions 1. REASON FOR CALL or QUESTION: "What is your reason for calling today?" or "How can I best help you?" or "What question do you have that I can help answer?"     appt  Protocols used: INFORMATION ONLY CALL-A-AH

## 2019-04-23 ENCOUNTER — Ambulatory Visit: Payer: Self-pay | Attending: Internal Medicine

## 2019-04-23 ENCOUNTER — Other Ambulatory Visit: Payer: Self-pay

## 2019-04-23 DIAGNOSIS — Z20822 Contact with and (suspected) exposure to covid-19: Secondary | ICD-10-CM | POA: Insufficient documentation

## 2019-04-25 LAB — NOVEL CORONAVIRUS, NAA: SARS-CoV-2, NAA: NOT DETECTED

## 2019-07-30 ENCOUNTER — Encounter (HOSPITAL_COMMUNITY): Payer: Self-pay | Admitting: Emergency Medicine

## 2019-07-30 ENCOUNTER — Emergency Department (HOSPITAL_COMMUNITY)
Admission: EM | Admit: 2019-07-30 | Discharge: 2019-07-30 | Disposition: A | Discharge status: Home / Self Care | Payer: Self-pay | Attending: Emergency Medicine | Admitting: Emergency Medicine

## 2019-07-30 ENCOUNTER — Other Ambulatory Visit: Payer: Self-pay

## 2019-07-30 ENCOUNTER — Emergency Department (HOSPITAL_COMMUNITY): Payer: Self-pay

## 2019-07-30 DIAGNOSIS — K2900 Acute gastritis without bleeding: Secondary | ICD-10-CM | POA: Insufficient documentation

## 2019-07-30 DIAGNOSIS — Z79899 Other long term (current) drug therapy: Secondary | ICD-10-CM | POA: Insufficient documentation

## 2019-07-30 DIAGNOSIS — R102 Pelvic and perineal pain: Secondary | ICD-10-CM

## 2019-07-30 DIAGNOSIS — Z87891 Personal history of nicotine dependence: Secondary | ICD-10-CM | POA: Insufficient documentation

## 2019-07-30 LAB — COMPREHENSIVE METABOLIC PANEL
ALT: 15 U/L (ref 0–44)
AST: 21 U/L (ref 15–41)
Albumin: 4.2 g/dL (ref 3.5–5.0)
Alkaline Phosphatase: 11 U/L — ABNORMAL LOW (ref 38–126)
Anion gap: 8 (ref 5–15)
BUN: 19 mg/dL (ref 6–20)
CO2: 27 mmol/L (ref 22–32)
Calcium: 9.2 mg/dL (ref 8.9–10.3)
Chloride: 104 mmol/L (ref 98–111)
Creatinine, Ser: 0.87 mg/dL (ref 0.44–1.00)
GFR calc Af Amer: >60 mL/min (ref >60)
GFR calc non Af Amer: >60 mL/min (ref >60)
Glucose, Bld: 100 mg/dL — ABNORMAL HIGH (ref 70–99)
Potassium: 4 mmol/L (ref 3.5–5.1)
Sodium: 139 mmol/L (ref 135–145)
Total Bilirubin: 0.4 mg/dL (ref 0.3–1.2)
Total Protein: 7.3 g/dL (ref 6.5–8.1)

## 2019-07-30 LAB — CBC WITH DIFFERENTIAL/PLATELET
Abs Immature Granulocytes: 0.01 10*3/uL (ref 0.00–0.07)
Basophils Absolute: 0 10*3/uL (ref 0.0–0.1)
Basophils Relative: 1 %
Eosinophils Absolute: 0.2 10*3/uL (ref 0.0–0.5)
Eosinophils Relative: 3 %
HCT: 43.9 % (ref 36.0–46.0)
Hemoglobin: 14.5 g/dL (ref 12.0–15.0)
Immature Granulocytes: 0 %
Lymphocytes Relative: 29 %
Lymphs Abs: 1.5 10*3/uL (ref 0.7–4.0)
MCH: 31.7 pg (ref 26.0–34.0)
MCHC: 33 g/dL (ref 30.0–36.0)
MCV: 96.1 fL (ref 80.0–100.0)
Monocytes Absolute: 0.3 10*3/uL (ref 0.1–1.0)
Monocytes Relative: 6 %
Neutro Abs: 3.3 10*3/uL (ref 1.7–7.7)
Neutrophils Relative %: 61 %
Platelets: 226 10*3/uL (ref 150–400)
RBC: 4.57 MIL/uL (ref 3.87–5.11)
RDW: 12.5 % (ref 11.5–15.5)
WBC: 5.3 10*3/uL (ref 4.0–10.5)
nRBC: 0 % (ref 0.0–0.2)

## 2019-07-30 LAB — URINALYSIS, ROUTINE W REFLEX MICROSCOPIC
Bilirubin Urine: NEGATIVE
Glucose, UA: NEGATIVE mg/dL
Hgb urine dipstick: NEGATIVE
Ketones, ur: NEGATIVE mg/dL
Leukocytes,Ua: NEGATIVE
Nitrite: NEGATIVE
Protein, ur: NEGATIVE mg/dL
Specific Gravity, Urine: 1.006 (ref 1.005–1.030)
pH: 7 (ref 5.0–8.0)

## 2019-07-30 LAB — I-STAT BETA HCG BLOOD, ED (MC, WL, AP ONLY): I-stat hCG, quantitative: <5 m[IU]/mL (ref <5)

## 2019-07-30 LAB — WET PREP, GENITAL
Clue Cells Wet Prep HPF POC: NONE SEEN
Sperm: NONE SEEN
Trich, Wet Prep: NONE SEEN
Yeast Wet Prep HPF POC: NONE SEEN

## 2019-07-30 LAB — LIPASE, BLOOD: Lipase: 43 U/L (ref 11–51)

## 2019-07-30 MED ORDER — FAMOTIDINE 20 MG PO TABS: 20.0000 mg | ORAL_TABLET | Freq: Two times a day (BID) | ORAL | 0 refills | Status: DC

## 2019-07-30 MED ORDER — IOHEXOL 300 MG/ML  SOLN
100.0000 mL | Freq: Once | INTRAMUSCULAR | Status: AC | PRN
  Administered 2019-07-30: 100 mL via INTRAVENOUS

## 2019-07-30 MED ORDER — DIPHENHYDRAMINE HCL 25 MG PO CAPS
25.0000 mg | ORAL_CAPSULE | Freq: Once | ORAL | Status: AC
  Administered 2019-07-30: 25 mg via ORAL
  Filled 2019-07-30: qty 1

## 2019-07-30 MED ORDER — SODIUM CHLORIDE 0.9 % IV BOLUS
1000.0000 mL | Freq: Once | INTRAVENOUS | Status: AC
  Administered 2019-07-30: 1000 mL via INTRAVENOUS

## 2019-07-30 MED ORDER — MORPHINE SULFATE (PF) 4 MG/ML IV SOLN
4.0000 mg | Freq: Once | INTRAVENOUS | Status: AC
  Administered 2019-07-30: 4 mg via INTRAVENOUS
  Filled 2019-07-30: qty 1

## 2019-07-30 MED ORDER — METHYLPREDNISOLONE SODIUM SUCC 125 MG IJ SOLR
125.0000 mg | Freq: Once | INTRAMUSCULAR | Status: AC
  Administered 2019-07-30: 125 mg via INTRAVENOUS
  Filled 2019-07-30: qty 2

## 2019-07-30 MED ORDER — SODIUM CHLORIDE (PF) 0.9 % IJ SOLN
INTRAMUSCULAR | Status: AC
  Filled 2019-07-30: qty 50

## 2019-07-30 NOTE — ED Provider Notes (Signed)
[facility]   CSN: [id number] Arrival date & time: 07/30/19  0746     History Chief Complaint  Patient presents with  . Abdominal Pain    Melissa Vang is a 34 y.o. female with past medical history of depression presenting to the ED with a chief complaint of abdominal pain.  Yesterday during the day started having epigastric pressure sensation.  Since waking up this morning she has had lower abdominal discomfort that is worse with palpation.  She reports nausea but denies any vomiting.  She has not taken any medications to help with her symptoms.  She denies any sick contacts with similar symptoms or suspicious food ingestions.  Patient was on her menstrual cycle 1 week ago does not believe she could be pregnant but is unsure.  She is sexually active but not concerned about STDs.  She reports normal vaginal discharge.  Denies any dysuria, changes to bowel movements, fever, prior abdominal surgeries, fever, cough, shortness of breath or chest pain.  HPI     Past Medical History:  Diagnosis Date  . Depression    [facility]2017  . Panic attack    04/2017  [facility]    Patient Active Problem List   Diagnosis Date Noted  . Depression   . Panic attack     Past Surgical History:  Procedure Laterality Date  . MOUTH SURGERY    . MOUTH SURGERY  age 34   for impacted tooth in hard palate     OB History   No obstetric history on file.     Family History  Problem Relation Age of Onset  . Diabetes Mother   . Arthritis Mother        disabled due to multiple joint and spine issues  . Cancer Mother        "precancer of ovaries"  . Diabetes Father     Social History   Tobacco Use  . Smoking status: Former Smoker    Packs/day: 0.25    Years: 18.00    Pack years: 4.50    Types: Cigarettes  . Smokeless tobacco: Never Used  Substance Use Topics  . Alcohol use: Yes    Comment: 1-2  drinks per week  . Drug use: No    Home Medications Prior to Admission medications   Medication Sig Start Date End Date Taking? Authorizing Provider  Acetaminophen (MIDOL PO) Take 2 tablets by mouth as needed (menestrual cycle).   Yes [provider]  acetaminophen (TYLENOL) 325 MG tablet Take 650-1,300 mg by mouth every 6 (six) hours as needed for mild pain or headache.   Yes [provider]  hydrOXYzine (VISTARIL) 25 MG capsule Take 25-50 mg by mouth 3 (three) times daily as needed for anxiety.   Yes [provider]  ibuprofen (ADVIL) 200 MG tablet Take 400 mg by mouth every 6 (six) hours as needed for headache or moderate pain.   Yes [provider]  Multiple Vitamin (MULTIVITAMIN WITH MINERALS) TABS tablet Take 1 tablet by mouth daily.   Yes [provider]  sertraline (ZOLOFT) 50 MG tablet Take 50 mg by mouth daily.   Yes [provider]  azithromycin (ZITHROMAX) 250 MG tablet Take 1 tablet (250 mg total) by mouth daily. Take 1 every day until finished. Patient not taking: Reported on 03/11/2018 07/20/17   Antony MaduraHumes, Kelly, PA-C  famotidine (PEPCID) 20 MG tablet Take 1 tablet (20 mg total)  by mouth 2 (two) times daily. 07/30/19   Tresha Muzio, PA-C  fexofenadine (ALLEGRA) 180 MG tablet Take 1 tablet (180 mg total) by mouth daily. Patient not taking: Reported on 03/11/2018 07/16/17   Julieanne Manson, MD  fluticasone [facility]) 50 MCG/ACT nasal spray Place 2 sprays into both nostrils daily. Patient not taking: Reported on 03/10/2019 07/16/17   Julieanne Manson, MD  ibuprofen (ADVIL,MOTRIN) 600 MG tablet Take 1 tablet (600 mg total) by mouth every 6 (six) hours as needed. Patient not taking: Reported on 03/11/2018 07/20/17   Antony Madura, PA-C  ondansetron (ZOFRAN) 4 MG tablet Take 1 tablet (4 mg total) by mouth every 6 (six) hours. Patient not taking: Reported on 03/10/2019 02/16/19   Milagros Loll, MD  phenol (CHLORASEPTIC) 1.4 % LIQD Use  as directed 1 spray in the mouth or throat as needed for throat irritation / pain. Patient not taking: Reported on 03/11/2018 07/20/17   Antony Madura, PA-C    Allergies    Amoxil [amoxicillin], Penicillins, and Morphine and related  Review of Systems   Review of Systems  Constitutional: Negative for appetite change, chills and fever.  HENT: Negative for ear pain, rhinorrhea, sneezing and sore throat.   Eyes: Negative for photophobia and visual disturbance.  Respiratory: Negative for cough, chest tightness, shortness of breath and wheezing.   Cardiovascular: Negative for chest pain and palpitations.  Gastrointestinal: Positive for abdominal pain and nausea. Negative for blood in stool, constipation, diarrhea and vomiting.  Genitourinary: Negative for dysuria, hematuria and urgency.  Musculoskeletal: Negative for myalgias.  Skin: Negative for rash.  Neurological: Negative for dizziness, weakness and light-headedness.    Physical Exam Updated Vital Signs BP 122/73   Pulse (!) 53   Temp 97.9 F (36.6 C) (Oral)   Resp 17   Ht 5\' 4"  (1.626 m)   Wt 72.6 kg   LMP 07/23/2019   SpO2 99%   BMI 27.46 kg/m   Physical Exam Vitals and nursing note reviewed. Exam conducted with a chaperone present.  Constitutional:      General: She is not in acute distress.    Appearance: She is well-developed.  HENT:     Head: Normocephalic and atraumatic.     Nose: Nose normal.  Eyes:     General: No scleral icterus.       Left eye: No discharge.     Conjunctiva/sclera: Conjunctivae normal.  Cardiovascular:     Rate and Rhythm: Normal rate and regular rhythm.     Heart sounds: Normal heart sounds. No murmur. No friction rub. No gallop.   Pulmonary:     Effort: Pulmonary effort is normal. No respiratory distress.     Breath sounds: Normal breath sounds.  Abdominal:     General: Bowel sounds are normal. There is no distension.     Palpations: Abdomen is soft.     Tenderness: There is abdominal  tenderness in the right lower quadrant, suprapubic area and left lower quadrant. There is no guarding.  Genitourinary:    Cervix: No cervical motion tenderness.     Adnexa:        Right: Tenderness present.        Left: Tenderness present.      Comments: Pelvic exam: normal external genitalia without evidence of trauma. VULVA: normal appearing vulva with no masses, tenderness or lesion. VAGINA: normal appearing vagina with normal color and discharge, no lesions. CERVIX: normal appearing cervix without lesions, cervical motion tenderness absent, cervical os closed with out  purulent discharge; No vaginal discharge. Wet prep and DNA probe for chlamydia and GC obtained.   ADNEXA: normal adnexa in size, with bilateral adnexal tenderness which appears mild.  No masses. UTERUS: uterus is normal size, shape, consistency and nontender.   Musculoskeletal:        General: Normal range of motion.     Cervical back: Normal range of motion and neck supple.  Skin:    General: Skin is warm and dry.     Findings: No rash.  Neurological:     Mental Status: She is alert.     Motor: No abnormal muscle tone.     Coordination: Coordination normal.     ED Results / Procedures / Treatments   Labs (all labs ordered are listed, but only abnormal results are displayed) Labs Reviewed  WET PREP, GENITAL - Abnormal; Notable for the following components:      Result Value   WBC, Wet Prep HPF POC FEW (*)    All other components within normal limits  COMPREHENSIVE METABOLIC PANEL - Abnormal; Notable for the following components:   Glucose, Bld 100 (*)    Alkaline Phosphatase 11 (*)    All other components within normal limits  URINALYSIS, ROUTINE W REFLEX MICROSCOPIC - Abnormal; Notable for the following components:   Color, Urine STRAW (*)    All other components within normal limits  CBC WITH DIFFERENTIAL/PLATELET  LIPASE, BLOOD  I-STAT BETA HCG BLOOD, ED ([facility], [facility], [facility] ONLY)  GC/CHLAMYDIA PROBE AMP ([facility]) NOT AT [facility]    EKG None  Radiology CT ABDOMEN PELVIS W CONTRAST  Result Date: 07/30/2019 CLINICAL DATA:  34 year old female with history of right lower quadrant abdominal pain. Suspected acute appendicitis. EXAM: CT ABDOMEN AND PELVIS WITH CONTRAST TECHNIQUE: Multidetector CT imaging of the abdomen and pelvis was performed using the standard protocol following bolus administration of intravenous contrast. CONTRAST:  OMNIPAQUE IOHEXOL 300 MG/ML  SOLN COMPARISON:  No priors. FINDINGS: Lower chest: Unremarkable. Hepatobiliary: No suspicious cystic or solid hepatic lesions. No intra or extrahepatic biliary ductal dilatation. Gallbladder is normal in appearance. Pancreas: No pancreatic mass. No pancreatic ductal dilatation. No pancreatic or peripancreatic fluid collections or inflammatory changes. Spleen: Unremarkable. Adrenals/Urinary Tract: Bilateral kidneys and adrenal glands are normal in appearance. No hydroureteronephrosis. Urinary bladder is normal in appearance. Stomach/Bowel: Normal appearance of the stomach. No pathologic dilatation of small bowel or colon. Normal appendix. Vascular/Lymphatic: No significant atherosclerotic disease, aneurysm or dissection noted in the abdominal or pelvic vasculature. No lymphadenopathy noted in the abdomen or pelvis. Reproductive: Uterus and ovaries are unremarkable in appearance. Small volume of free fluid in the cul-de-sac, presumably physiologic in this young female patient. Other: No larger volume of ascites.  No pneumoperitoneum. Musculoskeletal: There are no aggressive appearing lytic or blastic lesions noted in the visualized portions of the skeleton. IMPRESSION: 1. No acute findings are noted in the abdomen or pelvis. Specifically, the appendix is normal. 2. Small volume of free fluid in the cul-de-sac, presumably physiologic in this young female patient. Electronically Signed   By: Trudie Reed M.D.   On: 07/30/2019 12:07   US PELVIC  COMPLETE W TRANSVAGINAL AND TORSION R/O  Result Date: 07/30/2019 CLINICAL DATA:  Pelvic pain EXAM: TRANSABDOMINAL AND TRANSVAGINAL ULTRASOUND OF PELVIS DOPPLER ULTRASOUND OF OVARIES TECHNIQUE: Both transabdominal and transvaginal ultrasound examinations of the pelvis were performed. Transabdominal technique was performed for global imaging of the pelvis including uterus, ovaries, adnexal regions, and pelvic cul-de-sac. It was necessary to  proceed with endovaginal exam following the transabdominal exam to visualize the uterus, endometrium, ovaries and adnexa. Color and duplex Doppler ultrasound was utilized to evaluate blood flow to the ovaries. COMPARISON:  None. FINDINGS: Uterus Measurements: 7.7 x 4.7 x 6.3 cm = volume: 50.8 mL. No fibroids or other mass visualized. Endometrium Thickness: 9.9 mm.  No focal abnormality visualized. Right ovary Measurements: 2.6 x 2.0 x 2.4 cm = volume: 6.46 mL. Normal appearance/no adnexal mass. Left ovary Measurements: 3.9 x 2.9 x 2.8 cm = volume: 16.4 mL. Anechoic cyst with increased through transmission arising from the left ovary measures 2.6 x 2.2 x 2.0 cm Pulsed Doppler evaluation of both ovaries demonstrates normal low-resistance arterial and venous waveforms. Other findings Trace free fluid noted. IMPRESSION: 1. No evidence for ovarian torsion. 2. Left ovary cyst has a maximum dimension of 2.6 cm. This is almost certainly benign, and no specific imaging follow up is recommended according to the Society of Radiologists in Ultrasound2010 Consensus Conference Statement (D Lenis Noon et al. Management of Asymptomatic Ovarian and Other Adnexal Cysts Imaged at [place]: Society of Radiologists in Ultrasound Consensus Conference Statement 2010. Radiology 256 (Sept 2010): 943-954.). Electronically Signed   By: Signa Kell M.D.   On: 07/30/2019 14:15    Procedures Procedures (including critical care time)  Medications Ordered in ED Medications  sodium chloride (PF) 0.9 % injection  (has no administration in time range)  sodium chloride 0.9 % bolus 1,000 mL (1,000 mLs Intravenous New Bag/Given 07/30/19 0852)  morphine 4 MG/ML injection 4 mg (4 mg Intravenous Given 07/30/19 0956)  methylPREDNISolone sodium succinate (SOLU-MEDROL) 125 mg/2 mL injection 125 mg (125 mg Intravenous Given 07/30/19 1012)  diphenhydrAMINE (BENADRYL) capsule 25 mg (25 mg Oral Given 07/30/19 1011)  iohexol (OMNIPAQUE) 300 MG/ML solution 100 mL (100 mLs Intravenous Contrast Given 07/30/19 1123)    ED Course  I have reviewed the triage vital signs and the nursing notes.  Pertinent labs & imaging results that were available during my care of the patient were reviewed by me and considered in my medical decision making (see chart for details).  Clinical Course as of Jul 30 1426  Fri Jul 30, 2019  1003 Patient with erythema on L arm above IV site after morphine. She has not gotten morphine in the past. Patient tearful, anxious. No signs of angioedema or anaphylaxis. Will give steroids and benadryl.   [HK]    Clinical Course User Index [HK] Dietrich Pates, PA-C   MDM Rules/Calculators/A&P                      34 year old female presents to ED with a chief complaint of abdominal pain.  What started as epigastric pain yesterday is now progressed to lower abdominal pain.  Reports nausea but denies any vomiting.  Denies any changes to bowel movements, urination or vaginal complaints.  On exam there is tenderness palpation of the lower abdomen without rebound or guarding.  Pelvic exam revealed bilateral adnexal tenderness without uterine tenderness or cervical motion tenderness.  She is afebrile without recent use of antipyretics.  Work-up here significant for urinalysis without signs of infection, unremarkable CBC, CMP and hCG is negative.  Wet prep showing white blood cells.  Patient is not concerned about STDs.  CT abdomen pelvis without any acute findings.  Pelvic ultrasound shows 2 cm cyst.  Suspect that her  symptoms could be due to gastritis.  At this time are able to rule out ovarian torsion, cholecystitis, appendicitis  or other emergent or surgical cause of her symptoms.  She was remain comfortable here with fluids and pain control.  She did develop a reaction to morphine which improved with steroids and Benadryl.  We will have her take Pepcid as needed to help with her discomfort.  All imaging, if done today, including plain films, CT scans, and ultrasounds, independently reviewed by me, and interpretations confirmed via formal radiology reads.  Patient is hemodynamically stable, in NAD, and able to ambulate in the ED. Evaluation does not show pathology that would require ongoing emergent intervention or inpatient treatment. I explained the diagnosis to the patient. Pain has been managed and has no complaints prior to discharge. Patient is comfortable with above plan and is stable for discharge at this time. All questions were answered prior to disposition. Strict return precautions for returning to the ED were discussed. Encouraged follow up with PCP.   An After Visit Summary was printed and given to the patient.   Portions of this note were generated with [employer]. Dictation errors may occur despite best attempts at proofreading.  Final Clinical Impression(s) / ED Diagnoses Final diagnoses:  Other acute gastritis without hemorrhage    Rx / DC Orders ED Discharge Orders         Ordered    famotidine (PEPCID) 20 MG tablet  2 times daily     07/30/19 [address] PA-C 07/30/19 1428    Long, Wonda [facility], MD 07/31/19 0715

## 2019-07-30 NOTE — ED Triage Notes (Signed)
Patient arrived by self from home. Patient c/o epigastric abdominal pain that began last night.   Patient stated that pain worsened this morning upon waking. No N/V at this time. Patient stated she had some nausea yesterday w/o any episodes of vomiting.

## 2019-07-30 NOTE — Discharge Instructions (Signed)
Take the Pepcid as needed for your symptoms. Return to the ER if you start to experience worsening abdominal pain, develop a fever, chest pain, shortness of breath or inability to tolerate anything by mouth.

## 2019-08-02 LAB — GC/CHLAMYDIA PROBE AMP (~~LOC~~) NOT AT ARMC
Chlamydia: NEGATIVE
Comment: NEGATIVE
Comment: NORMAL
Neisseria Gonorrhea: NEGATIVE

## 2019-12-02 ENCOUNTER — Encounter (HOSPITAL_COMMUNITY): Payer: Self-pay | Admitting: Emergency Medicine

## 2019-12-02 ENCOUNTER — Other Ambulatory Visit: Payer: Self-pay

## 2019-12-02 ENCOUNTER — Emergency Department (HOSPITAL_COMMUNITY)
Admission: EM | Admit: 2019-12-02 | Discharge: 2019-12-02 | Disposition: A | Discharge status: Home / Self Care | Payer: Self-pay | Attending: Emergency Medicine | Admitting: Emergency Medicine

## 2019-12-02 ENCOUNTER — Emergency Department (HOSPITAL_COMMUNITY): Payer: Self-pay

## 2019-12-02 DIAGNOSIS — Z79899 Other long term (current) drug therapy: Secondary | ICD-10-CM | POA: Insufficient documentation

## 2019-12-02 DIAGNOSIS — R079 Chest pain, unspecified: Secondary | ICD-10-CM

## 2019-12-02 DIAGNOSIS — Z87891 Personal history of nicotine dependence: Secondary | ICD-10-CM | POA: Insufficient documentation

## 2019-12-02 DIAGNOSIS — R0602 Shortness of breath: Secondary | ICD-10-CM | POA: Insufficient documentation

## 2019-12-02 DIAGNOSIS — U071 COVID-19: Secondary | ICD-10-CM

## 2019-12-02 MED ORDER — IBUPROFEN 800 MG PO TABS
800.0000 mg | ORAL_TABLET | Freq: Once | ORAL | Status: AC
  Administered 2019-12-02: 800 mg via ORAL
  Filled 2019-12-02: qty 1

## 2019-12-02 NOTE — ED Provider Notes (Addendum)
[facility] Provider Note   CSN: [id number] Arrival date & time: 12/02/19  0801     History Chief Complaint  Patient presents with  . Chest Pain  . Abdominal Pain    Melissa Vang is a 34 y.o. female.  34 year old female presents with cough and sharp chest pain.  States she had her second Covid vaccine dose yesterday.  Has noted myalgias without fever.  No vomiting or diarrhea.  No leg pain or swelling.  Denies exertional dyspnea.  Has also had intermittent abdominal discomfort.  No treatment used for this prior to arrival        Past Medical History:  Diagnosis Date  . Depression    Currently following with [facility] 04/2017  . Panic attack    04/2017  currently followed at [facility]    Patient Active Problem List   Diagnosis Date Noted  . Depression   . Panic attack     Past Surgical History:  Procedure Laterality Date  . MOUTH SURGERY    . MOUTH SURGERY  age 16   for impacted tooth in hard palate     OB History   No obstetric history on file.     Family History  Problem Relation Age of Onset  . Diabetes Mother   . Arthritis Mother        disabled due to multiple joint and spine issues  . Cancer Mother        "precancer of ovaries"  . Diabetes Father     Social History   Tobacco Use  . Smoking status: Former Smoker    Packs/day: 0.25    Years: 18.00    Pack years: 4.50    Types: Cigarettes  . Smokeless tobacco: Never Used  Vaping Use  . Vaping Use: Every day  . Start date: 09/24/2016  . Last attempt to quit: 03/26/2017  . Substances: Nicotine  Substance Use Topics  . Alcohol use: Yes    Comment: 1-2 drinks per week  . Drug use: No    Home Medications Prior to Admission medications   Medication Sig Start Date End Date Taking? Authorizing Provider  Acetaminophen (MIDOL PO) Take 2 tablets by mouth as needed (menestrual cycle).    [provider]  acetaminophen (TYLENOL) 325 MG tablet Take  650-1,300 mg by mouth every 6 (six) hours as needed for mild pain or headache.    [provider]  azithromycin (ZITHROMAX) 250 MG tablet Take 1 tablet (250 mg total) by mouth daily. Take 1 every day until finished. Patient not taking: Reported on 03/11/2018 07/20/17   Antony Madura, PA-C  famotidine (PEPCID) 20 MG tablet Take 1 tablet (20 mg total) by mouth 2 (two) times daily. 07/30/19   Khatri, Hina, PA-C  fexofenadine (ALLEGRA) 180 MG tablet Take 1 tablet (180 mg total) by mouth daily. Patient not taking: Reported on 03/11/2018 07/16/17   Julieanne Manson, MD  fluticasone [facility]) 50 MCG/ACT nasal spray Place 2 sprays into both nostrils daily. Patient not taking: Reported on 03/10/2019 07/16/17   Julieanne Manson, MD  hydrOXYzine (VISTARIL) 25 MG capsule Take 25-50 mg by mouth 3 (three) times daily as needed for anxiety.    [provider]  ibuprofen (ADVIL) 200 MG tablet Take 400 mg by mouth every 6 (six) hours as needed for headache or moderate pain.    [provider]  ibuprofen (ADVIL,MOTRIN) 600 MG tablet Take 1 tablet (600 mg total) by mouth every 6 (six) hours as  needed. Patient not taking: Reported on 03/11/2018 07/20/17   Antony Madura, PA-C  Multiple Vitamin (MULTIVITAMIN WITH MINERALS) TABS tablet Take 1 tablet by mouth daily.    [provider]  ondansetron (ZOFRAN) 4 MG tablet Take 1 tablet (4 mg total) by mouth every 6 (six) hours. Patient not taking: Reported on 03/10/2019 02/16/19   Milagros Loll, MD  phenol (CHLORASEPTIC) 1.4 % LIQD Use as directed 1 spray in the mouth or throat as needed for throat irritation / pain. Patient not taking: Reported on 03/11/2018 07/20/17   Antony Madura, PA-C  sertraline (ZOLOFT) 50 MG tablet Take 50 mg by mouth daily.    [provider]    Allergies    Amoxil [amoxicillin], Penicillins, and Morphine and related  Review of Systems   Review of Systems  All other systems reviewed and are  negative.   Physical Exam Updated Vital Signs BP 122/75 (BP Location: Left Arm)   Pulse 75   Temp 98.6 F (37 C) (Oral)   Resp 16   Ht 1.626 m (5\' 4" )   Wt 77.1 kg   SpO2 100%   BMI 29.18 kg/m   Physical Exam Vitals and nursing note reviewed.  Constitutional:      General: She is not in acute distress.    Appearance: Normal appearance. She is well-developed. She is not toxic-appearing.  HENT:     Head: Normocephalic and atraumatic.  Eyes:     General: Lids are normal.     Conjunctiva/sclera: Conjunctivae normal.     Pupils: Pupils are equal, round, and reactive to light.  Neck:     Thyroid: No thyroid mass.     Trachea: No tracheal deviation.  Cardiovascular:     Rate and Rhythm: Normal rate and regular rhythm.     Heart sounds: Normal heart sounds. No murmur heard.  No gallop.   Pulmonary:     Effort: Pulmonary effort is normal. No respiratory distress.     Breath sounds: Normal breath sounds. No stridor. No decreased breath sounds, wheezing, rhonchi or rales.  Abdominal:     General: Bowel sounds are normal. There is no distension.     Palpations: Abdomen is soft.     Tenderness: There is no abdominal tenderness. There is no rebound.  Musculoskeletal:        General: No tenderness. Normal range of motion.     Cervical back: Normal range of motion and neck supple.  Skin:    General: Skin is warm and dry.     Findings: No abrasion or rash.  Neurological:     Mental Status: She is alert and oriented to person, place, and time.     GCS: GCS eye subscore is 4. GCS verbal subscore is 5. GCS motor subscore is 6.     Cranial Nerves: No cranial nerve deficit.     Sensory: No sensory deficit.  Psychiatric:        Speech: Speech normal.        Behavior: Behavior normal.     ED Results / Procedures / Treatments   Labs (all labs ordered are listed, but only abnormal results are displayed) Labs Reviewed - No data to display  EKG EKG  Interpretation  Date/Time:  Thursday December 02 2019 08:23:17 EDT Ventricular Rate:  80 PR Interval:    QRS Duration: 87 QT Interval:  362 QTC Calculation: 418 R Axis:   77 Text Interpretation: Sinus rhythm 12 Lead; Mason-Likar Confirmed by 10-07-2001 (  [id number]) on 12/02/2019 12:49:52 PM   Radiology No results found.  Procedures Procedures (including critical care time)  Medications Ordered in ED Medications  ibuprofen (ADVIL) tablet 800 mg (has no administration in time range)    ED Course  I have reviewed the triage vital signs and the nursing notes.  Pertinent labs & imaging results that were available during my care of the patient were reviewed by me and considered in my medical decision making (see chart for details).    MDM Rules/Calculators/A&P                          Chest x-ray without acute findings here.  She was medicated with Motrin.  EKG without acute findings.  Suspect symptoms are from ongoing Covid infection.  Low suspicion for PE or ACS.  Discharged with return precautions  Melissa Vang was evaluated in Emergency Department on 12/02/2019 for the symptoms described in the history of present illness. She was evaluated in the context of the global COVID-19 pandemic, which necessitated consideration that the patient might be at risk for infection with the SARS-CoV-2 virus that causes COVID-19. Institutional protocols and algorithms that pertain to the evaluation of patients at risk for COVID-19 are in a state of rapid change based on information released by regulatory bodies including the CDC and federal and state organizations. These policies and algorithms were followed during the patient's care in the ED.  Final Clinical Impression(s) / ED Diagnoses Final diagnoses:  SOB (shortness of breath)    Rx / DC Orders ED Discharge Orders    None       Lorre Nick, MD 12/02/19 1339    Lorre Nick, MD 12/02/19 1340

## 2019-12-02 NOTE — ED Notes (Signed)
An After Visit Summary was printed and given to the patient. Discharge instructions given and no further questions at this time.  

## 2019-12-02 NOTE — ED Triage Notes (Signed)
Per pt, states she just received 2nd covid shot-states she feels like she is having a slight reaction-states occasional stabbing CP that is worrying her, slight abdominal pain as well

## 2019-12-02 NOTE — Discharge Instructions (Addendum)
Your chest x-ray today was negative and EKG did not show any signs of acute changes.  Please take Tylenol and Motrin as directed to control your symptoms.  Return here if you become severely short of breath

## 2020-11-27 ENCOUNTER — Other Ambulatory Visit: Payer: Self-pay

## 2020-11-27 ENCOUNTER — Encounter (HOSPITAL_COMMUNITY): Payer: Self-pay

## 2020-11-27 ENCOUNTER — Emergency Department (HOSPITAL_COMMUNITY)
Admission: EM | Admit: 2020-11-27 | Discharge: 2020-11-28 | Disposition: A | Discharge status: Home / Self Care | Payer: Self-pay | Attending: Emergency Medicine | Admitting: Emergency Medicine

## 2020-11-27 DIAGNOSIS — R519 Headache, unspecified: Secondary | ICD-10-CM | POA: Insufficient documentation

## 2020-11-27 DIAGNOSIS — R531 Weakness: Secondary | ICD-10-CM | POA: Insufficient documentation

## 2020-11-27 DIAGNOSIS — M791 Myalgia, unspecified site: Secondary | ICD-10-CM | POA: Insufficient documentation

## 2020-11-27 DIAGNOSIS — Z87891 Personal history of nicotine dependence: Secondary | ICD-10-CM | POA: Insufficient documentation

## 2020-11-27 DIAGNOSIS — R0981 Nasal congestion: Secondary | ICD-10-CM | POA: Insufficient documentation

## 2020-11-27 DIAGNOSIS — R42 Dizziness and giddiness: Secondary | ICD-10-CM | POA: Insufficient documentation

## 2020-11-27 DIAGNOSIS — Z20822 Contact with and (suspected) exposure to covid-19: Secondary | ICD-10-CM | POA: Insufficient documentation

## 2020-11-27 DIAGNOSIS — R002 Palpitations: Secondary | ICD-10-CM | POA: Insufficient documentation

## 2020-11-27 NOTE — ED Notes (Signed)
Pt ambulatory in ED lobby. 

## 2020-11-27 NOTE — ED Notes (Signed)
MSE Waiver signed.

## 2020-11-27 NOTE — ED Triage Notes (Signed)
Pt complains of headache, fatigue, and palpitations x 2 weeks. Pt states that she just feels "out of body". Pt also reports that her arms burn when she holds them up too long.

## 2020-11-27 NOTE — ED Provider Notes (Signed)
Emergency Medicine Provider Triage Evaluation Note  Melissa Vang , a 35 y.o. female  was evaluated in triage.  Pt complains of ha, fatigue, palpitations, dizziness, myalgias. Recent covid exposure. Sxs for 2 wks. Unrelieved by home meds.   Review of Systems  Positive: Ha, dizziness, fatigue Negative: fever  Physical Exam  BP (!) 128/93 (BP Location: Left Arm)   Pulse (!) 54   Temp 98.7 F (37.1 C) (Oral)   Resp 18   SpO2 99%  Gen:   Awake, no distress   Resp:  Normal effort  MSK:   Moves extremities without difficulty  Other:  Regular rate/rhythm  Medical Decision Making  Medically screening exam initiated at 11:51 PM.  Appropriate orders placed.  Melissa Vang was informed that the remainder of the evaluation will be completed by another provider, this initial triage assessment does not replace that evaluation, and the importance of remaining in the ED until their evaluation is complete.  labs   Melissa Apley, PA-C 11/27/20 2352    Mesner, Barbara Cower, MD 11/28/20 [phone]

## 2020-11-28 LAB — COMPREHENSIVE METABOLIC PANEL
ALT: 12 U/L (ref 0–44)
AST: 18 U/L (ref 15–41)
Albumin: 5 g/dL (ref 3.5–5.0)
Alkaline Phosphatase: 13 U/L — ABNORMAL LOW (ref 38–126)
Anion gap: 11 (ref 5–15)
BUN: 21 mg/dL — ABNORMAL HIGH (ref 6–20)
CO2: 24 mmol/L (ref 22–32)
Calcium: 9.8 mg/dL (ref 8.9–10.3)
Chloride: 101 mmol/L (ref 98–111)
Creatinine, Ser: 1 mg/dL (ref 0.44–1.00)
GFR, Estimated: >60 mL/min (ref >60)
Glucose, Bld: 92 mg/dL (ref 70–99)
Potassium: 3.9 mmol/L (ref 3.5–5.1)
Sodium: 136 mmol/L (ref 135–145)
Total Bilirubin: 0.7 mg/dL (ref 0.3–1.2)
Total Protein: 8.5 g/dL — ABNORMAL HIGH (ref 6.5–8.1)

## 2020-11-28 LAB — CBC WITH DIFFERENTIAL/PLATELET
Abs Immature Granulocytes: 0.05 10*3/uL (ref 0.00–0.07)
Basophils Absolute: 0 10*3/uL (ref 0.0–0.1)
Basophils Relative: 1 %
Eosinophils Absolute: 0.1 10*3/uL (ref 0.0–0.5)
Eosinophils Relative: 2 %
HCT: 45.2 % (ref 36.0–46.0)
Hemoglobin: 15.4 g/dL — ABNORMAL HIGH (ref 12.0–15.0)
Immature Granulocytes: 1 %
Lymphocytes Relative: 41 %
Lymphs Abs: 2.2 10*3/uL (ref 0.7–4.0)
MCH: 31.6 pg (ref 26.0–34.0)
MCHC: 34.1 g/dL (ref 30.0–36.0)
MCV: 92.8 fL (ref 80.0–100.0)
Monocytes Absolute: 0.4 10*3/uL (ref 0.1–1.0)
Monocytes Relative: 7 %
Neutro Abs: 2.5 10*3/uL (ref 1.7–7.7)
Neutrophils Relative %: 48 %
Platelets: 225 10*3/uL (ref 150–400)
RBC: 4.87 MIL/uL (ref 3.87–5.11)
RDW: 12.1 % (ref 11.5–15.5)
WBC: 5.3 10*3/uL (ref 4.0–10.5)
nRBC: 0 % (ref 0.0–0.2)

## 2020-11-28 LAB — I-STAT BETA HCG BLOOD, ED (MC, WL, AP ONLY): I-stat hCG, quantitative: <5 m[IU]/mL (ref <5)

## 2020-11-28 LAB — RESP PANEL BY RT-PCR (FLU A&B, COVID) ARPGX2
Influenza A by PCR: NEGATIVE
Influenza B by PCR: NEGATIVE
SARS Coronavirus 2 by RT PCR: NEGATIVE

## 2020-11-28 MED ORDER — DIPHENHYDRAMINE HCL 25 MG PO CAPS
25.0000 mg | ORAL_CAPSULE | Freq: Once | ORAL | Status: AC
  Administered 2020-11-28: 25 mg via ORAL
  Filled 2020-11-28: qty 1

## 2020-11-28 MED ORDER — SODIUM CHLORIDE 0.9 % IV BOLUS
1000.0000 mL | Freq: Once | INTRAVENOUS | Status: AC
  Administered 2020-11-28: 1000 mL via INTRAVENOUS

## 2020-11-28 MED ORDER — METOCLOPRAMIDE HCL 10 MG PO TABS: 10.0000 mg | ORAL_TABLET | Freq: Three times a day (TID) | ORAL | 0 refills | Status: DC | PRN

## 2020-11-28 MED ORDER — KETOROLAC TROMETHAMINE 15 MG/ML IJ SOLN
15.0000 mg | Freq: Once | INTRAMUSCULAR | Status: AC
  Administered 2020-11-28: 15 mg via INTRAVENOUS
  Filled 2020-11-28: qty 1

## 2020-11-28 MED ORDER — METOCLOPRAMIDE HCL 5 MG/ML IJ SOLN
10.0000 mg | Freq: Once | INTRAMUSCULAR | Status: AC
  Administered 2020-11-28: 10 mg via INTRAVENOUS
  Filled 2020-11-28: qty 2

## 2020-11-28 MED ORDER — METOCLOPRAMIDE HCL 10 MG PO TABS
10.0000 mg | ORAL_TABLET | Freq: Once | ORAL | Status: DC
  Filled 2020-11-28: qty 1

## 2020-11-28 MED ORDER — NAPROXEN 500 MG PO TABS: 500.0000 mg | ORAL_TABLET | Freq: Two times a day (BID) | ORAL | 0 refills | Status: DC

## 2020-11-28 NOTE — Discharge Instructions (Addendum)
Your work-up today was overall reassuring.  Your blood work was normal and your COVID and flu test were negative. You likely have some component of dehydration, increase your fluid intake to help with your symptoms of dizziness, lightheadedness, and body aches.  This will also likely help your headache. If your headache returns, take naproxen with food.  You may take this twice a day as needed.  Take this in combination with the Reglan to help with headache. You may use over-the-counter nasal decongestant such as Flonase to help with your nasal congestion and headaches. Follow-up with the clinic listed below if your symptoms not improving for further evaluation. Return to the emergency room if you develop chest pain, difficulty breathing, any new, worsening, or concerning symptoms

## 2020-11-28 NOTE — ED Provider Notes (Signed)
[facility]-EMERGENCY DEPT Provider Note   CSN: [id number] Arrival date & time: 11/27/20  2312     History Chief Complaint  Patient presents with   Headache   Palpitations   Fatigue    Melissa Vang is a 35 y.o. female presenting for evaluation of headaches, generalized body aches, fatigue, nasal congestion, intermittent palpitations and lightheadedness.  Patient states she has not been feeling well for 2 weeks.  She reports generalized weakness, headaches, fatigue, nasal congestion.  Today she has had persistent headache.  She has been taking Mucinex and Sudafed without significant improvement of symptoms, has not taken anything else.  She reports a close contact that recently tested positive for COVID.  She had a negative antigen test at home.  No fevers, chills, chest pain, shortness of breath, cough, nausea, vomiting abdominal pain, urinary symptoms, abnormal bowel movements.  HPI     Past Medical History:  Diagnosis Date   Depression    Currently following with [facility] 04/2017   Panic attack    04/2017  currently followed at [facility]    Patient Active Problem List   Diagnosis Date Noted   Depression    Panic attack     Past Surgical History:  Procedure Laterality Date   MOUTH SURGERY     MOUTH SURGERY  age 89   for impacted tooth in hard palate     OB History   No obstetric history on file.     Family History  Problem Relation Age of Onset   Diabetes Mother    Arthritis Mother        disabled due to multiple joint and spine issues   Cancer Mother        "precancer of ovaries"   Diabetes Father     Social History   Tobacco Use   Smoking status: Former    Packs/day: 0.25    Years: 18.00    Pack years: 4.50    Types: Cigarettes   Smokeless tobacco: Never  Vaping Use   Vaping Use: Every day   Start date: 09/24/2016   Last attempt to quit: 03/26/2017   Substances: Nicotine  Substance Use Topics   Alcohol use: Yes    Comment:  1-2 drinks per week   Drug use: No    Home Medications Prior to Admission medications   Medication Sig Start Date End Date Taking? Authorizing Provider  metoCLOPramide (REGLAN) 10 MG tablet Take 1 tablet (10 mg total) by mouth every 8 (eight) hours as needed for nausea. 11/28/20  Yes Kenasia Scheller, PA-C  naproxen (NAPROSYN) 500 MG tablet Take 1 tablet (500 mg total) by mouth 2 (two) times daily with a meal. 11/28/20  Yes Betty Daidone, PA-C  Acetaminophen (MIDOL PO) Take 2 tablets by mouth as needed (menestrual cycle).    [provider]  acetaminophen (TYLENOL) 325 MG tablet Take 650-1,300 mg by mouth every 6 (six) hours as needed for mild pain or headache.    [provider]  azithromycin (ZITHROMAX) 250 MG tablet Take 1 tablet (250 mg total) by mouth daily. Take 1 every day until finished. Patient not taking: Reported on 03/11/2018 07/20/17   Antony Madura, PA-C  famotidine (PEPCID) 20 MG tablet Take 1 tablet (20 mg total) by mouth 2 (two) times daily. 07/30/19   Khatri, Hina, PA-C  fexofenadine (ALLEGRA) 180 MG tablet Take 1 tablet (180 mg total) by mouth daily. Patient not taking: Reported on 03/11/2018 07/16/17   Julieanne Manson, MD  fluticasone (FLONASE) 50 MCG/ACT nasal spray Place 2 sprays into both nostrils daily. Patient not taking: Reported on 03/10/2019 07/16/17   Julieanne Manson, MD  hydrOXYzine (VISTARIL) 25 MG capsule Take 25-50 mg by mouth 3 (three) times daily as needed for anxiety.    [provider]  ibuprofen (ADVIL) 200 MG tablet Take 400 mg by mouth every 6 (six) hours as needed for headache or moderate pain.    [provider]  ibuprofen (ADVIL,MOTRIN) 600 MG tablet Take 1 tablet (600 mg total) by mouth every 6 (six) hours as needed. Patient not taking: Reported on 03/11/2018 07/20/17   Antony Madura, PA-C  Multiple Vitamin (MULTIVITAMIN WITH MINERALS) TABS tablet Take 1 tablet by mouth daily.    [provider]   ondansetron (ZOFRAN) 4 MG tablet Take 1 tablet (4 mg total) by mouth every 6 (six) hours. Patient not taking: Reported on 03/10/2019 02/16/19   Milagros Loll, MD  phenol (CHLORASEPTIC) 1.4 % LIQD Use as directed 1 spray in the mouth or throat as needed for throat irritation / pain. Patient not taking: Reported on 03/11/2018 07/20/17   Antony Madura, PA-C  sertraline (ZOLOFT) 50 MG tablet Take 50 mg by mouth daily.    [provider]    Allergies    Amoxil [amoxicillin], Penicillins, and Morphine and related  Review of Systems   Review of Systems  HENT:  Positive for congestion.   Cardiovascular:  Positive for palpitations.  Musculoskeletal:  Positive for myalgias.  Neurological:  Positive for light-headedness and headaches.  All other systems reviewed and are negative.  Physical Exam Updated Vital Signs BP 128/78   Pulse 65   Temp 98.7 F (37.1 C) (Oral)   Resp 16   SpO2 99%   Physical Exam Vitals and nursing note reviewed.  Constitutional:      General: She is not in acute distress.    Appearance: Normal appearance.     Comments: Resting in the bed in NAD  HENT:     Head: Normocephalic and atraumatic.  Eyes:     Extraocular Movements: Extraocular movements intact.     Conjunctiva/sclera: Conjunctivae normal.     Pupils: Pupils are equal, round, and reactive to light.  Cardiovascular:     Rate and Rhythm: Normal rate and regular rhythm.     Pulses: Normal pulses.     Comments: Regular rate and rhythym Pulmonary:     Effort: Pulmonary effort is normal. No respiratory distress.     Breath sounds: Normal breath sounds. No wheezing.     Comments: Speaking in full sentences.  Clear lung sounds in all fields. Abdominal:     General: There is no distension.     Palpations: Abdomen is soft. There is no mass.     Tenderness: There is no abdominal tenderness. There is no guarding or rebound.  Musculoskeletal:        General: Normal range of motion.     Cervical  back: Normal range of motion and neck supple.  Skin:    General: Skin is warm and dry.     Capillary Refill: Capillary refill takes less than 2 seconds.  Neurological:     Mental Status: She is alert and oriented to person, place, and time.     GCS: GCS eye subscore is 4. GCS verbal subscore is 5. GCS motor subscore is 6.     Sensory: Sensation is intact.     Motor: Motor function is intact.  Psychiatric:  Mood and Affect: Mood and affect normal.        Speech: Speech normal.        Behavior: Behavior normal.    ED Results / Procedures / Treatments   Labs (all labs ordered are listed, but only abnormal results are displayed) Labs Reviewed  CBC WITH DIFFERENTIAL/PLATELET - Abnormal; Notable for the following components:      Result Value   Hemoglobin 15.4 (*)    All other components within normal limits  COMPREHENSIVE METABOLIC PANEL - Abnormal; Notable for the following components:   BUN 21 (*)    Total Protein 8.5 (*)    Alkaline Phosphatase 13 (*)    All other components within normal limits  RESP PANEL BY RT-PCR (FLU A&B, COVID) ARPGX2  TSH  I-STAT BETA HCG BLOOD, ED ([facility], [facility], [facility] ONLY)    EKG None  Radiology No results found.  Procedures Procedures   Medications Ordered in ED Medications  sodium chloride 0.9 % bolus 1,000 mL (0 mLs Intravenous Stopped 11/28/20 0401)  ketorolac (TORADOL) 15 MG/ML injection 15 mg (15 mg Intravenous Given 11/28/20 0242)  metoCLOPramide (REGLAN) injection 10 mg (10 mg Intravenous Given 11/28/20 0243)  diphenhydrAMINE (BENADRYL) capsule 25 mg (25 mg Oral Given 11/28/20 0253)    ED Course  I have reviewed the triage vital signs and the nursing notes.  Pertinent labs & imaging results that were available during my care of the patient were reviewed by me and considered in my medical decision making (see chart for details).    MDM Rules/Calculators/A&P                           Patient presenting for evaluation of generalized  weakness, body aches, congestion, headache, and intermittent lightheadedness and palpitations.  This is happened to standing.  Consider dehydration/orthostatic.  Will check blood work to ensure no electrolyte abnormality or anemia.  COVID and flu ordered.  EKG due to complaints of palpitations.  Labs interpreted by me, overall reassuring.  Electrolytes are stable.  Hemoglobin stable.  Leukocytosis.  EKG shows bradycardia with arrhythmia, however on reevaluation, patient without cardiac.  After headache cocktail and fluids, patient reports improvement of symptoms.  Orthostatics show slight drop in blood pressure when standing, this is after fluids.  As such, I feel a lot of her symptoms are likely related to dehydration.  Discussed overall reassuring findings with patient.  Discussed that at this time, there does not appear to be acutely life-threatening condition requiring hospitalization discussed symptomatic management and follow-up with primary care, resources given.  At this time, patient appears safe for discharge.  Return precautions given.  Patient states she understands and agrees to plan.   Final Clinical Impression(s) / ED Diagnoses Final diagnoses:  Acute nonintractable headache, unspecified headache type  Myalgia  Dizziness    Rx / DC Orders ED Discharge Orders          Ordered    metoCLOPramide (REGLAN) 10 MG tablet  Every 8 hours PRN        11/28/20 0354    naproxen (NAPROSYN) 500 MG tablet  2 times daily with meals        11/28/20 0354             Kanon Novosel, PA-C 11/28/20 0414    Mesner, Barbara Cower, MD 11/28/20 [phone]

## 2021-02-01 ENCOUNTER — Emergency Department (HOSPITAL_COMMUNITY)
Admission: EM | Admit: 2021-02-01 | Discharge: 2021-02-02 | Disposition: A | Discharge status: Home / Self Care | Payer: Self-pay | Attending: Emergency Medicine | Admitting: Emergency Medicine

## 2021-02-01 ENCOUNTER — Encounter (HOSPITAL_COMMUNITY): Payer: Self-pay | Admitting: Emergency Medicine

## 2021-02-01 DIAGNOSIS — M542 Cervicalgia: Secondary | ICD-10-CM | POA: Insufficient documentation

## 2021-02-01 DIAGNOSIS — Z87891 Personal history of nicotine dependence: Secondary | ICD-10-CM | POA: Insufficient documentation

## 2021-02-01 DIAGNOSIS — K625 Hemorrhage of anus and rectum: Secondary | ICD-10-CM | POA: Insufficient documentation

## 2021-02-01 DIAGNOSIS — R1032 Left lower quadrant pain: Secondary | ICD-10-CM | POA: Insufficient documentation

## 2021-02-01 DIAGNOSIS — W228XXA Striking against or struck by other objects, initial encounter: Secondary | ICD-10-CM | POA: Insufficient documentation

## 2021-02-01 DIAGNOSIS — S00431A Contusion of right ear, initial encounter: Secondary | ICD-10-CM | POA: Insufficient documentation
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
## 2021-02-02 ENCOUNTER — Encounter (HOSPITAL_COMMUNITY): Payer: Self-pay

## 2021-02-02 ENCOUNTER — Other Ambulatory Visit: Payer: Self-pay

## 2021-02-02 ENCOUNTER — Emergency Department (HOSPITAL_COMMUNITY): Payer: Self-pay

## 2021-02-02 LAB — CBC WITH DIFFERENTIAL/PLATELET
Abs Immature Granulocytes: 0.01 10*3/uL (ref 0.00–0.07)
Basophils Absolute: 0 10*3/uL (ref 0.0–0.1)
Basophils Relative: 1 %
Eosinophils Absolute: 0 10*3/uL (ref 0.0–0.5)
Eosinophils Relative: 1 %
HCT: 41.9 % (ref 36.0–46.0)
Hemoglobin: 14.1 g/dL (ref 12.0–15.0)
Immature Granulocytes: 0 %
Lymphocytes Relative: 33 %
Lymphs Abs: 2.1 10*3/uL (ref 0.7–4.0)
MCH: 31.5 pg (ref 26.0–34.0)
MCHC: 33.7 g/dL (ref 30.0–36.0)
MCV: 93.5 fL (ref 80.0–100.0)
Monocytes Absolute: 0.4 10*3/uL (ref 0.1–1.0)
Monocytes Relative: 6 %
Neutro Abs: 3.7 10*3/uL (ref 1.7–7.7)
Neutrophils Relative %: 59 %
Platelets: 247 10*3/uL (ref 150–400)
RBC: 4.48 MIL/uL (ref 3.87–5.11)
RDW: 12.7 % (ref 11.5–15.5)
WBC: 6.2 10*3/uL (ref 4.0–10.5)
nRBC: 0 % (ref 0.0–0.2)

## 2021-02-02 LAB — COMPREHENSIVE METABOLIC PANEL
ALT: 10 U/L (ref 0–44)
AST: 19 U/L (ref 15–41)
Albumin: 4.4 g/dL (ref 3.5–5.0)
Alkaline Phosphatase: 12 U/L — ABNORMAL LOW (ref 38–126)
Anion gap: 9 (ref 5–15)
BUN: 15 mg/dL (ref 6–20)
CO2: 22 mmol/L (ref 22–32)
Calcium: 9 mg/dL (ref 8.9–10.3)
Chloride: 106 mmol/L (ref 98–111)
Creatinine, Ser: 0.81 mg/dL (ref 0.44–1.00)
GFR, Estimated: >60 mL/min (ref >60)
Glucose, Bld: 94 mg/dL (ref 70–99)
Potassium: 3.9 mmol/L (ref 3.5–5.1)
Sodium: 137 mmol/L (ref 135–145)
Total Bilirubin: 0.8 mg/dL (ref 0.3–1.2)
Total Protein: 7.4 g/dL (ref 6.5–8.1)

## 2021-02-02 LAB — I-STAT BETA HCG BLOOD, ED (MC, WL, AP ONLY): I-stat hCG, quantitative: <5 m[IU]/mL (ref <5)

## 2021-02-02 LAB — LIPASE, BLOOD: Lipase: 39 U/L (ref 11–51)

## 2021-02-02 MED ORDER — IOHEXOL 350 MG/ML SOLN
80.0000 mL | Freq: Once | INTRAVENOUS | Status: AC | PRN
  Administered 2021-02-02: 80 mL via INTRAVENOUS

## 2021-02-02 MED ORDER — HYDROCORTISONE ACETATE 25 MG RE SUPP: 25.0000 mg | Freq: Two times a day (BID) | RECTAL | 0 refills | Status: DC

## 2021-02-13 ENCOUNTER — Encounter (HOSPITAL_COMMUNITY): Payer: Self-pay | Admitting: Emergency Medicine

## 2021-02-13 ENCOUNTER — Other Ambulatory Visit: Payer: Self-pay

## 2021-02-13 ENCOUNTER — Emergency Department (HOSPITAL_COMMUNITY): Payer: Self-pay

## 2021-02-13 ENCOUNTER — Emergency Department (HOSPITAL_COMMUNITY)
Admission: EM | Admit: 2021-02-13 | Discharge: 2021-02-14 | Disposition: A | Discharge status: Home / Self Care | Payer: Self-pay | Attending: Emergency Medicine | Admitting: Emergency Medicine

## 2021-02-13 DIAGNOSIS — Z87891 Personal history of nicotine dependence: Secondary | ICD-10-CM | POA: Insufficient documentation

## 2021-02-13 DIAGNOSIS — R0789 Other chest pain: Secondary | ICD-10-CM | POA: Insufficient documentation

## 2021-02-13 DIAGNOSIS — Z79899 Other long term (current) drug therapy: Secondary | ICD-10-CM | POA: Insufficient documentation

## 2021-02-13 DIAGNOSIS — R079 Chest pain, unspecified: Secondary | ICD-10-CM

## 2021-02-13 DIAGNOSIS — R002 Palpitations: Secondary | ICD-10-CM | POA: Insufficient documentation

## 2021-02-13 DIAGNOSIS — R001 Bradycardia, unspecified: Secondary | ICD-10-CM | POA: Insufficient documentation

## 2021-02-13 DIAGNOSIS — R42 Dizziness and giddiness: Secondary | ICD-10-CM | POA: Insufficient documentation

## 2021-02-13 LAB — URINALYSIS, ROUTINE W REFLEX MICROSCOPIC
Bilirubin Urine: NEGATIVE
Glucose, UA: NEGATIVE mg/dL
Hgb urine dipstick: NEGATIVE
Ketones, ur: 5 mg/dL — AB
Leukocytes,Ua: NEGATIVE
Nitrite: NEGATIVE
Protein, ur: NEGATIVE mg/dL
Specific Gravity, Urine: 1.02 (ref 1.005–1.030)
pH: 5 (ref 5.0–8.0)

## 2021-02-13 LAB — I-STAT BETA HCG BLOOD, ED (MC, WL, AP ONLY): I-stat hCG, quantitative: <5 m[IU]/mL (ref <5)

## 2021-02-13 LAB — CBC
HCT: 41.3 % (ref 36.0–46.0)
Hemoglobin: 13.9 g/dL (ref 12.0–15.0)
MCH: 31.2 pg (ref 26.0–34.0)
MCHC: 33.7 g/dL (ref 30.0–36.0)
MCV: 92.6 fL (ref 80.0–100.0)
Platelets: 257 10*3/uL (ref 150–400)
RBC: 4.46 MIL/uL (ref 3.87–5.11)
RDW: 12.8 % (ref 11.5–15.5)
WBC: 5.7 10*3/uL (ref 4.0–10.5)
nRBC: 0 % (ref 0.0–0.2)

## 2021-02-13 LAB — BASIC METABOLIC PANEL
Anion gap: 7 (ref 5–15)
BUN: 21 mg/dL — ABNORMAL HIGH (ref 6–20)
CO2: 24 mmol/L (ref 22–32)
Calcium: 9.6 mg/dL (ref 8.9–10.3)
Chloride: 106 mmol/L (ref 98–111)
Creatinine, Ser: 0.93 mg/dL (ref 0.44–1.00)
GFR, Estimated: >60 mL/min (ref >60)
Glucose, Bld: 94 mg/dL (ref 70–99)
Potassium: 3.9 mmol/L (ref 3.5–5.1)
Sodium: 137 mmol/L (ref 135–145)

## 2021-02-13 LAB — TROPONIN I (HIGH SENSITIVITY)
Troponin I (High Sensitivity): <2 ng/L (ref <18)
Troponin I (High Sensitivity): <2 ng/L (ref <18)

## 2021-02-13 LAB — MAGNESIUM: Magnesium: 2.2 mg/dL (ref 1.7–2.4)

## 2021-02-13 MED ORDER — PANTOPRAZOLE SODIUM 40 MG PO TBEC
40.0000 mg | DELAYED_RELEASE_TABLET | Freq: Once | ORAL | Status: AC
  Administered 2021-02-13: 40 mg via ORAL
  Filled 2021-02-13: qty 1

## 2021-02-13 MED ORDER — ASPIRIN 81 MG PO CHEW
324.0000 mg | CHEWABLE_TABLET | Freq: Once | ORAL | Status: AC
  Administered 2021-02-13: 324 mg via ORAL
  Filled 2021-02-13: qty 4

## 2021-02-13 NOTE — ED Provider Notes (Signed)
[facility] DEPT Provider Note   CSN: [id number] Arrival date & time: 02/13/21  1524     History No chief complaint on file.   Melissa Vang is a 35 y.o. female presents to the emergency department with complaints of palpitations, chest pain, lightheadedness and near syncope around noon.  She reports she was seen by the RN at work who recommended she be evaluated.  Pt reports the pain is achy in nature and radiates into her throat and bilateral arms.  Pt reports it is not specifically aggravated by exertion.  She reports it is "random." No aggravating or alleviating factors.  Does reports increased stress in her life. Pt reports she has a hx of palpitations and similar symptoms intermittently for months.  She reports she does drink some caffeine but has cut back because it makes her anxious.  Pt reports she vapes nicotine but not marijuana recently.  Pt reports 1-2x times per week.  No cigarettes.  No other drug use. Pt does report chronic neck pain. Pt denies seeing cardiology or PCP for any of this recently. Pt denies diagnosis of any cardiac issues.   The history is provided by the patient and medical records. No language interpreter was used.      Past Medical History:  Diagnosis Date   Depression    Currently following with [facility] 04/2017   Panic attack    04/2017  currently followed at [facility]    Patient Active Problem List   Diagnosis Date Noted   Depression    Panic attack     Past Surgical History:  Procedure Laterality Date   MOUTH SURGERY     MOUTH SURGERY  age 31   for impacted tooth in hard palate     OB History   No obstetric history on file.     Family History  Problem Relation Age of Onset   Diabetes Mother    Arthritis Mother        disabled due to multiple joint and spine issues   Cancer Mother        "precancer of ovaries"   Diabetes Father     Social History   Tobacco Use   Smoking status: Former    Packs/day:  0.25    Years: 18.00    Pack years: 4.50    Types: Cigarettes   Smokeless tobacco: Never  Vaping Use   Vaping Use: Every day   Start date: 09/24/2016   Last attempt to quit: 03/26/2017   Substances: Nicotine  Substance Use Topics   Alcohol use: Yes    Comment: 1-2 drinks per week   Drug use: No    Home Medications Prior to Admission medications   Medication Sig Start Date End Date Taking? Authorizing Provider  hydrOXYzine (VISTARIL) 25 MG capsule Take 25-50 mg by mouth 3 (three) times daily as needed for anxiety.   Yes [provider]  pantoprazole (PROTONIX) 40 MG tablet Take 1 tablet (40 mg total) by mouth daily. 02/14/21  Yes Eustolia Drennen, Jarrett Soho, PA-C  acetaminophen (TYLENOL) 325 MG tablet Take 650-1,300 mg by mouth every 6 (six) hours as needed for mild pain or headache. Patient not taking: Reported on 02/13/2021    [provider]  azithromycin (ZITHROMAX) 250 MG tablet Take 1 tablet (250 mg total) by mouth daily. Take 1 every day until finished. Patient not taking: No sig reported 07/20/17   Antonietta Breach, PA-C  famotidine (PEPCID) 20 MG tablet Take 1 tablet (20 mg  total) by mouth 2 (two) times daily. Patient not taking: Reported on 02/13/2021 07/30/19   Delia Heady, PA-C  fexofenadine (ALLEGRA) 180 MG tablet Take 1 tablet (180 mg total) by mouth daily. Patient not taking: Reported on 02/13/2021 07/16/17   Mack Hook, MD  fluticasone [facility]) 50 MCG/ACT nasal spray Place 2 sprays into both nostrils daily. Patient not taking: Reported on 02/13/2021 07/16/17   Mack Hook, MD  hydrocortisone (ANUSOL-HC) 25 MG suppository Place 1 suppository (25 mg total) rectally 2 (two) times daily. For 7 days Patient not taking: Reported on 02/13/2021 02/02/21   Orpah Greek, MD  ibuprofen (ADVIL) 200 MG tablet Take 400 mg by mouth every 6 (six) hours as needed for headache or moderate pain. Patient not taking: Reported on 02/13/2021    [provider]   ibuprofen (ADVIL,MOTRIN) 600 MG tablet Take 1 tablet (600 mg total) by mouth every 6 (six) hours as needed. Patient not taking: Reported on 03/11/2018 07/20/17   Antonietta Breach, PA-C  metoCLOPramide (REGLAN) 10 MG tablet Take 1 tablet (10 mg total) by mouth every 8 (eight) hours as needed for nausea. Patient not taking: Reported on 02/13/2021 11/28/20   Caccavale, Sophia, PA-C  Multiple Vitamin (MULTIVITAMIN WITH MINERALS) TABS tablet Take 1 tablet by mouth daily. Patient not taking: Reported on 02/13/2021    [provider]  naproxen (NAPROSYN) 500 MG tablet Take 1 tablet (500 mg total) by mouth 2 (two) times daily with a meal. Patient not taking: Reported on 02/13/2021 11/28/20   Caccavale, Sophia, PA-C  ondansetron (ZOFRAN) 4 MG tablet Take 1 tablet (4 mg total) by mouth every 6 (six) hours. Patient not taking: Reported on 02/13/2021 02/16/19   Lucrezia Starch, MD  phenol (CHLORASEPTIC) 1.4 % LIQD Use as directed 1 spray in the mouth or throat as needed for throat irritation / pain. Patient not taking: Reported on 02/13/2021 07/20/17   Antonietta Breach, PA-C  sertraline (ZOLOFT) 50 MG tablet Take 50 mg by mouth daily. Patient not taking: Reported on 02/13/2021    [provider]    Allergies    Amoxicillin, Penicillins, Morphine and related, and Molds & smuts  Review of Systems   Review of Systems  Constitutional:  Negative for appetite change, diaphoresis, fatigue, fever and unexpected weight change.  HENT:  Negative for mouth sores.   Eyes:  Negative for visual disturbance.  Respiratory:  Positive for chest tightness. Negative for cough, shortness of breath and wheezing.   Cardiovascular:  Positive for chest pain and palpitations.  Gastrointestinal:  Negative for abdominal pain, constipation, diarrhea, nausea and vomiting.  Endocrine: Negative for polydipsia, polyphagia and polyuria.  Genitourinary:  Negative for dysuria, frequency, hematuria and urgency.  Musculoskeletal:   Negative for back pain and neck stiffness.  Skin:  Negative for rash.  Allergic/Immunologic: Negative for immunocompromised state.  Neurological:  Positive for light-headedness. Negative for syncope and headaches.  Hematological:  Does not bruise/bleed easily.  Psychiatric/Behavioral:  Negative for sleep disturbance. The patient is not nervous/anxious.    Physical Exam Updated Vital Signs BP 112/73   Pulse 76   Temp 98 F (36.7 C) (Oral)   Resp 14   Ht 5\' 4"  (1.626 m)   Wt 78 kg   LMP 01/28/2021 (Approximate) Comment: negative beta HCG 02/02/21  SpO2 99%   BMI 29.52 kg/m   Physical Exam Vitals and nursing note reviewed.  Constitutional:      General: She is not in acute distress.    Appearance:  She is not diaphoretic.  HENT:     Head: Normocephalic.     Mouth/Throat:     Mouth: Mucous membranes are moist.  Eyes:     General: No scleral icterus.    Conjunctiva/sclera: Conjunctivae normal.  Cardiovascular:     Rate and Rhythm: Normal rate and regular rhythm.     Pulses: Normal pulses.          Radial pulses are 2+ on the right side and 2+ on the left side.  Pulmonary:     Effort: No tachypnea, accessory muscle usage, prolonged expiration, respiratory distress or retractions.     Breath sounds: No stridor.     Comments: Equal chest rise. No increased work of breathing. Abdominal:     General: There is no distension.     Palpations: Abdomen is soft.     Tenderness: There is no abdominal tenderness. There is no guarding or rebound.  Musculoskeletal:     Cervical back: Normal range of motion.     Comments: Moves all extremities equally and without difficulty.  Skin:    General: Skin is warm and dry.     Capillary Refill: Capillary refill takes less than 2 seconds.  Neurological:     Mental Status: She is alert.     GCS: GCS eye subscore is 4. GCS verbal subscore is 5. GCS motor subscore is 6.     Comments: Speech is clear and goal oriented.  Psychiatric:        Mood  and Affect: Mood normal.    ED Results / Procedures / Treatments   Labs (all labs ordered are listed, but only abnormal results are displayed) Labs Reviewed  BASIC METABOLIC PANEL - Abnormal; Notable for the following components:      Result Value   BUN 21 (*)    All other components within normal limits  URINALYSIS, ROUTINE W REFLEX MICROSCOPIC - Abnormal; Notable for the following components:   Ketones, ur 5 (*)    All other components within normal limits  CBC  TSH  BRAIN NATRIURETIC PEPTIDE  MAGNESIUM  RAPID URINE DRUG SCREEN, HOSP PERFORMED  I-STAT BETA HCG BLOOD, ED ([facility], [facility], [facility] ONLY)  TROPONIN I (HIGH SENSITIVITY)  TROPONIN I (HIGH SENSITIVITY)    EKG EKG Interpretation  Date/Time:  Wednesday February 14 2021 00:37:48 EDT Ventricular Rate:  46 PR Interval:  180 QRS Duration: 92 QT Interval:  431 QTC Calculation: 377 R Axis:   83 Text Interpretation: Sinus bradycardia Atrial premature complexes Low voltage, precordial leads Confirmed by Zadie Rhine ([id number]) on 02/14/2021 12:44:54 AM     Radiology DG Chest 2 View  Result Date: 02/13/2021 CLINICAL DATA:  Chest pain, palpitation EXAM: CHEST - 2 VIEW COMPARISON:  12/02/2019 FINDINGS: The heart size and mediastinal contours are within normal limits. Both lungs are clear. The visualized skeletal structures are unremarkable. IMPRESSION: No active cardiopulmonary disease. Electronically Signed   By: Ernie Avena M.D.   On: 02/13/2021 16:56    Procedures Procedures   Medications Ordered in ED Medications  aspirin chewable tablet 324 mg (324 mg Oral Given 02/13/21 2331)  pantoprazole (PROTONIX) EC tablet 40 mg (40 mg Oral Given 02/13/21 2331)    ED Course  I have reviewed the triage vital signs and the nursing notes.  Pertinent labs & imaging results that were available during my care of the patient were reviewed by me and considered in my medical decision making (see chart for details).    MDM  Rules/Calculators/A&P                           Pt here is chest pain and palpitations intermittent for some time.  Work-up here reassuring.  Pt with bradycardic arrhythmia here at rest, but no evidence of heart block.  Labs and CXR reassuring.    Suspect some element of anxiety, caffeine and nicotine usage causing palpitations.  Also some concern for possible gastritis/GERD.  Patient will be started on Protonix.  No evidence of ACS today.  Unclear why patient has bradycardia.  Will refer to cardiology for further work-up.  Orthostatic VS for the past 24 hrs:  BP- Lying Pulse- Lying BP- Sitting Pulse- Sitting BP- Standing at 0 minutes Pulse- Standing at 0 minutes  02/14/21 0040 118/76 54 132/87 80 124/88 59    Discussed close primary care, cardiology follow-up and reasons to return to the emergency department.  Patient states understanding and is in agreement with the plan.   Final Clinical Impression(s) / ED Diagnoses Final diagnoses:  Palpitations  Chest pain, unspecified type  Bradycardia    Rx / DC Orders ED Discharge Orders          Ordered    pantoprazole (PROTONIX) 40 MG tablet  Daily        02/14/21 0041             Celine Dishman, Gwenlyn Perking 02/14/21 0114    Dorie Rank, MD 02/14/21 1557

## 2021-02-13 NOTE — ED Provider Notes (Signed)
Emergency Medicine Provider Triage Evaluation Note  Melissa Vang , a 34 y.o. female  was evaluated in triage.  Pt complains of intermittent palpitations, chest pain that began while she was at work today.  Felt that this pain was likely radiating to her right chest along with the front of her jaw.  Similar episodes last week at work where she felt palpitations.  No prior history of CAD, no family history of CAD, non-smoker but does report vaping.  Does not have any PCP care.  No prior history of blood clots.  Review of Systems  Positive: Chest pain, jaw pain, tingling  Negative: Shortness of breaht  Physical Exam  BP 128/79 (BP Location: Left Arm)   Pulse (!) 50   Temp 98 F (36.7 C) (Oral)   Resp 18   Ht 5\' 4"  (1.626 m)   Wt 78 kg   LMP 01/28/2021 (Approximate) Comment: negative beta HCG 02/02/21  SpO2 97%   BMI 29.52 kg/m  Gen:   Awake, no distress   Resp:  Normal effort  MSK:   Moves extremities without difficulty  Other:    Medical Decision Making  Medically screening exam initiated at 3:53 PM.  Appropriate orders placed.  Melissa Vang was informed that the remainder of the evaluation will be completed by another provider, this initial triage assessment does not replace that evaluation, and the importance of remaining in the ED until their evaluation is complete.  Hemodynamically stable patient here with intermittent chest pain, jaw pain.  Does report prior CAD history in the past.  Labs have been ordered.   Stevan Born, PA-C 02/13/21 1555    13/01/22, MD 02/20/21 [phone]

## 2021-02-13 NOTE — ED Triage Notes (Signed)
Reports ongoing palpitations, chest pain, arm hand hand numbness. Had an episode of chest pain at work today, was told her HR and BP was low by the RN and advised to come to the ED. Reports midline jaw pain that started a few hours ago.

## 2021-02-14 LAB — RAPID URINE DRUG SCREEN, HOSP PERFORMED
Amphetamines: NOT DETECTED
Barbiturates: NOT DETECTED
Benzodiazepines: NOT DETECTED
Cocaine: NOT DETECTED
Opiates: NOT DETECTED
Tetrahydrocannabinol: NOT DETECTED

## 2021-02-14 LAB — TSH: TSH: 2.532 u[IU]/mL (ref 0.350–4.500)

## 2021-02-14 LAB — BRAIN NATRIURETIC PEPTIDE: B Natriuretic Peptide: 8.9 pg/mL (ref 0.0–100.0)

## 2021-02-14 MED ORDER — PANTOPRAZOLE SODIUM 40 MG PO TBEC: 40.0000 mg | DELAYED_RELEASE_TABLET | Freq: Every day | ORAL | 0 refills | Status: DC

## 2021-02-14 NOTE — Discharge Instructions (Addendum)
1. Medications: Protonix, usual home medications 2. Treatment: rest, drink plenty of fluids, decrease smoking and caffeine usage 3. Follow Up: Please followup with your primary doctor and cardiology in 3-5 days for discussion of your diagnoses and further evaluation after today's visit; if you do not have a primary care doctor use the resource guide provided to find one; Please return to the ER for

## 2021-02-24 ENCOUNTER — Other Ambulatory Visit: Payer: Self-pay

## 2021-02-24 ENCOUNTER — Emergency Department (HOSPITAL_COMMUNITY)
Admission: EM | Admit: 2021-02-24 | Discharge: 2021-02-24 | Disposition: A | Discharge status: Home / Self Care | Payer: Medicaid Other | Attending: Emergency Medicine | Admitting: Emergency Medicine

## 2021-02-24 DIAGNOSIS — K0889 Other specified disorders of teeth and supporting structures: Secondary | ICD-10-CM | POA: Insufficient documentation

## 2021-02-24 DIAGNOSIS — Z87891 Personal history of nicotine dependence: Secondary | ICD-10-CM | POA: Insufficient documentation

## 2021-02-24 MED ORDER — CLINDAMYCIN HCL 150 MG PO CAPS: 300.0000 mg | ORAL_CAPSULE | Freq: Three times a day (TID) | ORAL | 0 refills | Status: AC

## 2021-02-24 NOTE — Discharge Instructions (Addendum)
Please pick up antibiotics and take as prescribed.  I would recommend 800 mg Ibuprofen (4 OTC tablets) every 8 hours and 1,000 mg Tylenol ( 2 double strength OTC tablets) every 8 hours. Alternate the medications every 4 hours.   You can apply oragel to your teeth and apply ice as needed for pain.   You can follow up with Dr. Garvin Fila or any of the dentists attached on the additional resource guide.

## 2021-02-24 NOTE — ED Provider Notes (Signed)
[facility] EMERGENCY DEPARTMENT Provider Note   CSN: [id number] Arrival date & time: 02/24/21  2217     History Chief Complaint  Patient presents with   Dental Pain    Melissa Vang is a 35 y.o. female who presents to the ED today with complaint of gradual onset, constant, achy, right upper dental pain for the past few days.  She reports history of poor dentition throughout.  She does not have a dentist currently.  She has been taking over-the-counter medications without relief.  She denies any fevers or chills.  She does report that whenever air hits her tooth she has worsening pain.  Denies any facial swelling.  She reports last menstrual cycle was 5 days ago.   The history is provided by the patient and medical records.      Past Medical History:  Diagnosis Date   Depression    Currently following with [facility] 04/2017   Panic attack    04/2017  currently followed at [facility]    Patient Active Problem List   Diagnosis Date Noted   Depression    Panic attack     Past Surgical History:  Procedure Laterality Date   MOUTH SURGERY     MOUTH SURGERY  age 44   for impacted tooth in hard palate     OB History   No obstetric history on file.     Family History  Problem Relation Age of Onset   Diabetes Mother    Arthritis Mother        disabled due to multiple joint and spine issues   Cancer Mother        "precancer of ovaries"   Diabetes Father     Social History   Tobacco Use   Smoking status: Former    Packs/day: 0.25    Years: 18.00    Pack years: 4.50    Types: Cigarettes   Smokeless tobacco: Never  Vaping Use   Vaping Use: Every day   Start date: 09/24/2016   Last attempt to quit: 03/26/2017   Substances: Nicotine  Substance Use Topics   Alcohol use: Yes    Comment: 1-2 drinks per week   Drug use: No    Home Medications Prior to Admission medications   Medication Sig Start Date End Date Taking? Authorizing Provider   clindamycin (CLEOCIN) 150 MG capsule Take 2 capsules (300 mg total) by mouth 3 (three) times daily for 7 days. 02/24/21 03/03/21 Yes Malessa Zartman, PA-C  acetaminophen (TYLENOL) 325 MG tablet Take 650-1,300 mg by mouth every 6 (six) hours as needed for mild pain or headache. Patient not taking: Reported on 02/13/2021    [provider]  azithromycin (ZITHROMAX) 250 MG tablet Take 1 tablet (250 mg total) by mouth daily. Take 1 every day until finished. Patient not taking: No sig reported 07/20/17   Antony Madura, PA-C  famotidine (PEPCID) 20 MG tablet Take 1 tablet (20 mg total) by mouth 2 (two) times daily. Patient not taking: Reported on 02/13/2021 07/30/19   Dietrich Pates, PA-C  fexofenadine (ALLEGRA) 180 MG tablet Take 1 tablet (180 mg total) by mouth daily. Patient not taking: Reported on 02/13/2021 07/16/17   Julieanne Manson, MD  fluticasone [facility]) 50 MCG/ACT nasal spray Place 2 sprays into both nostrils daily. Patient not taking: Reported on 02/13/2021 07/16/17   Julieanne Manson, MD  hydrocortisone (ANUSOL-HC) 25 MG suppository Place 1 suppository (25 mg total) rectally 2 (two) times daily. For 7 days Patient  not taking: Reported on 02/13/2021 02/02/21   Gilda Crease, MD  hydrOXYzine (VISTARIL) 25 MG capsule Take 25-50 mg by mouth 3 (three) times daily as needed for anxiety.    [provider]  ibuprofen (ADVIL) 200 MG tablet Take 400 mg by mouth every 6 (six) hours as needed for headache or moderate pain. Patient not taking: Reported on 02/13/2021    [provider]  ibuprofen (ADVIL,MOTRIN) 600 MG tablet Take 1 tablet (600 mg total) by mouth every 6 (six) hours as needed. Patient not taking: Reported on 03/11/2018 07/20/17   Antony Madura, PA-C  metoCLOPramide (REGLAN) 10 MG tablet Take 1 tablet (10 mg total) by mouth every 8 (eight) hours as needed for nausea. Patient not taking: Reported on 02/13/2021 11/28/20   Caccavale, Sophia, PA-C  Multiple Vitamin  (MULTIVITAMIN WITH MINERALS) TABS tablet Take 1 tablet by mouth daily. Patient not taking: Reported on 02/13/2021    [provider]  naproxen (NAPROSYN) 500 MG tablet Take 1 tablet (500 mg total) by mouth 2 (two) times daily with a meal. Patient not taking: Reported on 02/13/2021 11/28/20   Caccavale, Sophia, PA-C  ondansetron (ZOFRAN) 4 MG tablet Take 1 tablet (4 mg total) by mouth every 6 (six) hours. Patient not taking: Reported on 02/13/2021 02/16/19   Milagros Loll, MD  pantoprazole (PROTONIX) 40 MG tablet Take 1 tablet (40 mg total) by mouth daily. 02/14/21   Muthersbaugh, Dahlia Client, PA-C  phenol (CHLORASEPTIC) 1.4 % LIQD Use as directed 1 spray in the mouth or throat as needed for throat irritation / pain. Patient not taking: Reported on 02/13/2021 07/20/17   Antony Madura, PA-C  sertraline (ZOLOFT) 50 MG tablet Take 50 mg by mouth daily. Patient not taking: Reported on 02/13/2021    [provider]    Allergies    Amoxicillin, Penicillins, Morphine and related, and Molds & smuts  Review of Systems   Review of Systems  Constitutional:  Negative for chills and fever.  HENT:  Positive for dental problem. Negative for facial swelling.   All other systems reviewed and are negative.  Physical Exam Updated Vital Signs BP 137/82 (BP Location: Right Arm)   Pulse 87   Temp 98 F (36.7 C)   Resp 18   LMP 01/28/2021 (Approximate) Comment: negative beta HCG 02/02/21  SpO2 98%   Physical Exam Vitals and nursing note reviewed.  Constitutional:      Appearance: She is not ill-appearing.  HENT:     Head: Normocephalic and atraumatic.     Mouth/Throat:     Comments: Nose clear.  R upper tooth #1 decayed with caries with  TTP, with minimal surrounding gingival swelling and erythema, no definite abscess, no evidence of ludwig's.  Oropharynx clear and moist, without uvular swelling or deviation, no trismus or drooling, no tonsillar swelling or erythema, no exudates.    Eyes:      Conjunctiva/sclera: Conjunctivae normal.  Cardiovascular:     Rate and Rhythm: Normal rate and regular rhythm.  Pulmonary:     Effort: Pulmonary effort is normal.     Breath sounds: Normal breath sounds.  Skin:    General: Skin is warm and dry.     Coloration: Skin is not jaundiced.  Neurological:     Mental Status: She is alert.    ED Results / Procedures / Treatments   Labs (all labs ordered are listed, but only abnormal results are displayed) Labs Reviewed - No data to display  EKG None  Radiology No results found.  Procedures Procedures   Medications Ordered in ED Medications - No data to display  ED Course  I have reviewed the triage vital signs and the nursing notes.  Pertinent labs & imaging results that were available during my care of the patient were reviewed by me and considered in my medical decision making (see chart for details).    MDM Rules/Calculators/A&P                           35 year old female who presents to the ED today with complaint of right upper dental pain for the past few days.  On arrival to the ED vitals are stable.  Patient appears to be no acute distress.  On exam she has poor dentition throughout.  Dental caries noted to tooth #1 with gingival erythema.  No concern for Ludwig's angina.  Tolerating her own secretions without difficulty.  There is no obvious drainable abscess appreciated at this time.  Will discharge home with antibiotics.  She reports anaphylactic reaction to amoxicillin.  Will discharge home with clindamycin.  Patient reports last normal menstrual period was 5 days ago.  She denies having any intercourse since having her period and denies risk of pregnancy at this time.  Will discharge patient home.  Will provide information for dental resources.  Patient instructed to take ibuprofen and Tylenol as needed for pain.  She is in agreement with plan and stable for discharge.   This note was prepared using Dragon voice  recognition software and may include unintentional dictation errors due to the inherent limitations of voice recognition software.   Final Clinical Impression(s) / ED Diagnoses Final diagnoses:  Pain, dental    Rx / DC Orders ED Discharge Orders          Ordered    clindamycin (CLEOCIN) 150 MG capsule  3 times daily        02/24/21 2238             Discharge Instructions      Please pick up antibiotics and take as prescribed.  I would recommend 800 mg Ibuprofen (4 OTC tablets) every 8 hours and 1,000 mg Tylenol ( 2 double strength OTC tablets) every 8 hours. Alternate the medications every 4 hours.   You can apply oragel to your teeth and apply ice as needed for pain.   You can follow up with Dr. Garvin Fila or any of the dentists attached on the additional resource guide.       Tanda Rockers, PA-C 02/24/21 2240    Terald Sleeper, MD 02/24/21 [phone]

## 2021-02-24 NOTE — ED Triage Notes (Signed)
Pt presents with upper right dental pain. No dental insurance at this time. No fever or chills. EDPA present at time of triage.

## 2021-03-21 ENCOUNTER — Ambulatory Visit (HOSPITAL_COMMUNITY)
Admission: RE | Admit: 2021-03-21 | Discharge: 2021-03-21 | Disposition: A | Discharge status: Home / Self Care | Payer: Self-pay | Attending: Psychiatry | Admitting: Psychiatry

## 2021-03-21 NOTE — H&P (Signed)
Behavioral Health Medical Screening Exam  Melissa Vang is a 35 y.o. female who presented to [facility], voluntarily, as a walk-in with complaint of worsening anxiety and depression. Patient was seen, chart reviewed and case discussed with the treatment team and Dr Dwyane Dee.  She has a history of OCD, PTSD form being in the [employer] for 17 years, GAD and depression. She stated she has depressive symptoms of fatigue, tearfulness, guilt, loss of interest in usual pleasures, worthlessness and irritability. She stated she was on sertraline and hydroxyzine but has not taken them in a year. She was going to [facility] for these medications. Patient feels overwhelmed with her living situation, getting ready to be evicted due to a neighbor causing trouble. 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 She stated her house is a mess because she has no energy to clean it up. She stated this adds to her OCD tendencies of wanting everything in order. She stated her anxiety is causing her to be a hypochondriac and she has been to the emergency room more than she likes to admit. She stated she reached out to [facility] for help and they directed her to [facility]. She was hoping to get on medication by coming to [facility] today. Her protective factors are her boyfriend of 3 years, her mother, friends, pets and her job.   She is alert & oriented x 4; calm, cooperative; and mood is congruent with affect.  She is speaking in a clear tone at moderate volume, and in a normal pace. She maintains good eye contact.  She is appropriately dressed and her hygiene is neat. Her thought process is coherent and relevant; there is no indication that he/she is currently responding to internal/external stimuli or experiencing delusional thought content; and she has denied suicidal/self-harm/homicidal ideation, psychosis, and paranoia. Patient stated she has never attempted to harm herself but does have suicidal thoughts when extremely overwhelmed. She stated "I could not do that  to my loved ones and I don't want to die." Patient does not meet criteria for inpatient psychiatric admission. Patient was provided with outpatient resources for medication management and therapy. Patient left [facility] in no apparent distress.   Total Time spent with patient: 30 minutes  Psychiatric Specialty Exam:  Presentation  General Appearance: Appropriate for Environment; Casual; Fairly Groomed  Eye Contact:Good  Speech:Clear and Coherent; Normal Rate  Speech Volume:Normal  Handedness:Right  Mood and Affect  Mood:Depressed; Anxious  Affect:Appropriate; Congruent  Thought Process  Thought Processes:Coherent; Goal Directed  Descriptions of Associations:Intact  Orientation:Full (Time, Place and Person)  Thought Content:Logical  History of Schizophrenia/Schizoaffective disorder:No data recorded Duration of Psychotic Symptoms:No data recorded Hallucinations:Hallucinations: Auditory Description of Auditory Hallucinations: hears what sounds like mumbling about once a week  Ideas of Reference:None  Suicidal Thoughts:Suicidal Thoughts: No  Homicidal Thoughts:Homicidal Thoughts: No  Sensorium  Memory:Immediate Good; Recent Good; Remote Good  Judgment:Good  Insight:Good  Executive Functions  Concentration:Good  Attention Span:Good  [place] of Knowledge:Good  Language:Good  Psychomotor Activity  Psychomotor Activity:Psychomotor Activity: Normal  Assets  Assets:Communication Skills; Housing; Catering manager; Desire for Improvement; Social Support; Transportation; Vocational/Educational  Sleep  Sleep:Sleep: Fair  Physical Exam: Physical Exam Vitals reviewed.  Constitutional:      Appearance: Normal appearance.  HENT:     Head: Normocephalic and atraumatic.     Nose: Nose normal.  Eyes:     Pupils: Pupils are equal, round, and reactive to light.  Pulmonary:     Effort: Pulmonary effort is normal.  Musculoskeletal:         General: Normal range of motion.     Cervical back: Normal range of motion.  Neurological:     General: No focal deficit present.     Mental Status: She is alert and oriented to person, place, and time.  Psychiatric:        Attention and Perception: Attention normal. She perceives auditory hallucinations.        Mood and Affect: Mood is anxious and depressed.        Speech: Speech normal.        Behavior: Behavior normal. Behavior is cooperative.        Thought Content: Thought content normal. Thought content is not paranoid or delusional. Thought content does not include homicidal or suicidal ideation. Thought content does not include homicidal or suicidal plan.        Cognition and Memory: Cognition normal.     Comments: Occasionally hears mumbling   Review of Systems  Constitutional: Negative.  Negative for fever.  HENT: Negative.  Negative for congestion and sore throat.   Respiratory: Negative.  Negative for cough and shortness of breath.   Cardiovascular: Negative.  Negative for chest pain.  Genitourinary: Negative.   Musculoskeletal: Negative.   Neurological: Negative.   Psychiatric/Behavioral:  Positive for depression. The patient is nervous/anxious.   Blood pressure 127/83, pulse (!) 52, temperature 98 F (36.7 C), temperature source Oral, resp. rate 16, SpO2 100 %. There is no height or weight on file to calculate BMI.  Musculoskeletal: Strength & Muscle Tone: within normal limits Gait & Station: normal Patient leans: N/A  Recommendations:  Based on my evaluation the patient does not appear to have an emergency medical condition. Patient does not meet criteria for inpatient admission. Patient was provided with outpatient resources. Patient left [facility] under no apparent distress.   Laveda Abbe, NP 03/21/2021, 7:58 PM

## 2021-03-21 NOTE — BH Assessment (Signed)
@  1635, Clinician received notice by the [facility] (Linsey,RN)  that patient  presented to [facility] as a walk-in. The provider completed her MSE and informed this TTS Clinician that patient did not meet criteria for inpatient treatment. Per [facility] provider, no TTS assessment to be initiated and/or completed for this visit. The provider discharged patient with outpatient mental health resources.

## 2021-03-24 ENCOUNTER — Other Ambulatory Visit: Payer: Self-pay

## 2021-03-24 ENCOUNTER — Emergency Department (HOSPITAL_COMMUNITY)
Admission: EM | Admit: 2021-03-24 | Discharge: 2021-03-24 | Disposition: A | Discharge status: Home / Self Care | Payer: Medicaid Other | Attending: Emergency Medicine | Admitting: Emergency Medicine

## 2021-03-24 DIAGNOSIS — Z87891 Personal history of nicotine dependence: Secondary | ICD-10-CM | POA: Insufficient documentation

## 2021-03-24 DIAGNOSIS — Z20822 Contact with and (suspected) exposure to covid-19: Secondary | ICD-10-CM | POA: Insufficient documentation

## 2021-03-24 DIAGNOSIS — J069 Acute upper respiratory infection, unspecified: Secondary | ICD-10-CM | POA: Insufficient documentation

## 2021-03-24 LAB — RESP PANEL BY RT-PCR (FLU A&B, COVID) ARPGX2
Influenza A by PCR: NEGATIVE
Influenza B by PCR: NEGATIVE
SARS Coronavirus 2 by RT PCR: NEGATIVE

## 2021-03-24 NOTE — ED Provider Notes (Signed)
Emergency Medicine Provider Triage Evaluation Note  Melissa Vang , a 35 y.o. female  was evaluated in triage.  Pt complains of cough and body aches  Review of Systems  Positive: Flu exposure Negative: Abdominal pain  Physical Exam  BP 125/90 (BP Location: Right Arm)   Pulse 89   Temp 99.4 F (37.4 C) (Oral)   Resp 16   Ht 5\' 4"  (1.626 m)   Wt 72.6 kg   SpO2 99%   BMI 27.46 kg/m  Gen:   Awake, no distress   Resp:  Normal effort  MSK:   Moves extremities without difficulty  Other:    Medical Decision Making  Medically screening exam initiated at 1:41 PM.  Appropriate orders placed.  Melissa Vang was informed that the remainder of the evaluation will be completed by another provider, this initial triage assessment does not replace that evaluation, and the importance of remaining in the ED until their evaluation is complete.     Stevan Born, Elson Areas 03/24/21 1342    14/10/22, MD 04/13/21 [phone]

## 2021-03-24 NOTE — Discharge Instructions (Addendum)
Your COVID and flu testing is negative today.  You likely are having symptoms of a viral illness.  Take over-the-counter medications as we discussed.  I have attached a work note for the next couple of days.

## 2021-03-24 NOTE — ED Triage Notes (Signed)
Reports generalized body aches, HA, back pain, sore throat and tenderness, hot and cold chills. Tested at home for Covid and it was negative.

## 2021-03-24 NOTE — ED Provider Notes (Signed)
[facility]-EMERGENCY DEPT Provider Note   CSN: [id number] Arrival date & time: 03/24/21  1240     History No chief complaint on file.   Melissa Vang is a 35 y.o. female with no pertinent past medical history presenting today with complaint of URI symptoms that began yesterday.  Reports that she has multiple sick contacts.  Has felt feverish with chills, sore throat and weakness.  No difficulty breathing or chest pain.  No nausea vomiting or diarrhea.  Took ibuprofen yesterday however continues with discomfort.   Past Medical History:  Diagnosis Date   Depression    Currently following with [facility] 04/2017   Panic attack    04/2017  currently followed at [facility]    Patient Active Problem List   Diagnosis Date Noted   Depression    Panic attack     Past Surgical History:  Procedure Laterality Date   MOUTH SURGERY     MOUTH SURGERY  age 78   for impacted tooth in hard palate     OB History   No obstetric history on file.     Family History  Problem Relation Age of Onset   Diabetes Mother    Arthritis Mother        disabled due to multiple joint and spine issues   Cancer Mother        "precancer of ovaries"   Diabetes Father     Social History   Tobacco Use   Smoking status: Former    Packs/day: 0.25    Years: 18.00    Pack years: 4.50    Types: Cigarettes   Smokeless tobacco: Never  Vaping Use   Vaping Use: Every day   Start date: 09/24/2016   Last attempt to quit: 03/26/2017   Substances: Nicotine  Substance Use Topics   Alcohol use: Yes    Comment: 1-2 drinks per week   Drug use: No    Home Medications Prior to Admission medications   Medication Sig Start Date End Date Taking? Authorizing Provider  acetaminophen (TYLENOL) 325 MG tablet Take 650-1,300 mg by mouth every 6 (six) hours as needed for mild pain or headache. Patient not taking: Reported on 02/13/2021    [provider]  azithromycin (ZITHROMAX) 250 MG  tablet Take 1 tablet (250 mg total) by mouth daily. Take 1 every day until finished. Patient not taking: No sig reported 07/20/17   Antony Madura, PA-C  famotidine (PEPCID) 20 MG tablet Take 1 tablet (20 mg total) by mouth 2 (two) times daily. Patient not taking: Reported on 02/13/2021 07/30/19   Dietrich Pates, PA-C  fexofenadine (ALLEGRA) 180 MG tablet Take 1 tablet (180 mg total) by mouth daily. Patient not taking: Reported on 02/13/2021 07/16/17   Julieanne Manson, MD  fluticasone [facility]) 50 MCG/ACT nasal spray Place 2 sprays into both nostrils daily. Patient not taking: Reported on 02/13/2021 07/16/17   Julieanne Manson, MD  hydrocortisone (ANUSOL-HC) 25 MG suppository Place 1 suppository (25 mg total) rectally 2 (two) times daily. For 7 days Patient not taking: Reported on 02/13/2021 02/02/21   Gilda Crease, MD  hydrOXYzine (VISTARIL) 25 MG capsule Take 25-50 mg by mouth 3 (three) times daily as needed for anxiety.    [provider]  ibuprofen (ADVIL) 200 MG tablet Take 400 mg by mouth every 6 (six) hours as needed for headache or moderate pain. Patient not taking: Reported on 02/13/2021    [provider]  ibuprofen (ADVIL,MOTRIN) 600 MG tablet  Take 1 tablet (600 mg total) by mouth every 6 (six) hours as needed. Patient not taking: Reported on 03/11/2018 07/20/17   Antony Madura, PA-C  metoCLOPramide (REGLAN) 10 MG tablet Take 1 tablet (10 mg total) by mouth every 8 (eight) hours as needed for nausea. Patient not taking: Reported on 02/13/2021 11/28/20   Caccavale, Sophia, PA-C  Multiple Vitamin (MULTIVITAMIN WITH MINERALS) TABS tablet Take 1 tablet by mouth daily. Patient not taking: Reported on 02/13/2021    [provider]  naproxen (NAPROSYN) 500 MG tablet Take 1 tablet (500 mg total) by mouth 2 (two) times daily with a meal. Patient not taking: Reported on 02/13/2021 11/28/20   Caccavale, Sophia, PA-C  ondansetron (ZOFRAN) 4 MG tablet Take 1 tablet (4 mg total)  by mouth every 6 (six) hours. Patient not taking: Reported on 02/13/2021 02/16/19   Milagros Loll, MD  pantoprazole (PROTONIX) 40 MG tablet Take 1 tablet (40 mg total) by mouth daily. 02/14/21   Muthersbaugh, Dahlia Client, PA-C  phenol (CHLORASEPTIC) 1.4 % LIQD Use as directed 1 spray in the mouth or throat as needed for throat irritation / pain. Patient not taking: Reported on 02/13/2021 07/20/17   Antony Madura, PA-C  sertraline (ZOLOFT) 50 MG tablet Take 50 mg by mouth daily. Patient not taking: Reported on 02/13/2021    [provider]    Allergies    Amoxicillin, Penicillins, Morphine and related, and Molds & smuts  Review of Systems   Review of Systems  Constitutional:  Positive for chills and fatigue.  HENT:  Positive for sore throat.   Respiratory:  Positive for cough.   Gastrointestinal:  Negative for diarrhea, nausea and vomiting.  Musculoskeletal:  Positive for back pain and myalgias.  Neurological:  Positive for headaches.   Physical Exam Updated Vital Signs BP 125/90 (BP Location: Right Arm)   Pulse 89   Temp 99.4 F (37.4 C) (Oral)   Resp 16   Ht 5\' 4"  (1.626 m)   Wt 72.6 kg   SpO2 99%   BMI 27.46 kg/m   Physical Exam Vitals and nursing note reviewed.  Constitutional:      General: She is not in acute distress.    Appearance: Normal appearance. She is not ill-appearing.  HENT:     Head: Normocephalic and atraumatic.     Right Ear: Tympanic membrane normal.     Left Ear: Tympanic membrane normal.     Nose: Nose normal. No congestion or rhinorrhea.     Mouth/Throat:     Mouth: Mucous membranes are moist.     Pharynx: Oropharynx is clear. No oropharyngeal exudate or posterior oropharyngeal erythema.  Eyes:     General: No scleral icterus.    Conjunctiva/sclera: Conjunctivae normal.  Cardiovascular:     Rate and Rhythm: Normal rate and regular rhythm.  Pulmonary:     Effort: Pulmonary effort is normal. No respiratory distress.     Breath sounds: No  wheezing.  Abdominal:     General: Abdomen is flat. There is no distension.     Palpations: Abdomen is soft.  Lymphadenopathy:     Cervical: No cervical adenopathy.  Skin:    General: Skin is warm and dry.     Findings: No rash.  Neurological:     Mental Status: She is alert.  Psychiatric:        Mood and Affect: Mood normal.    ED Results / Procedures / Treatments   Labs (all labs ordered are listed, but  only abnormal results are displayed) Labs Reviewed  RESP PANEL BY RT-PCR (FLU A&B, COVID) ARPGX2    EKG None  Radiology No results found.  Procedures Procedures   Medications Ordered in ED Medications - No data to display  ED Course  I have reviewed the triage vital signs and the nursing notes.  Pertinent labs & imaging results that were available during my care of the patient were reviewed by me and considered in my medical decision making (see chart for details).    MDM Rules/Calculators/A&P Fully evaluated by me, no acute distress.  COVID and flu testing negative.  Likely other viral URI.  We discussed proper over-the-counter care for which she is agreeable.  Work note supplied.  Stable to be discharged home.  Final Clinical Impression(s) / ED Diagnoses Final diagnoses:  Viral upper respiratory tract infection    Rx / DC Orders Results and diagnoses were explained to the patient. Return precautions discussed in full. Patient had no additional questions and expressed complete understanding.     Saddie Benders, PA-C 03/24/21 1459    Terald Sleeper, MD 03/24/21 [phone]

## 2021-03-27 ENCOUNTER — Encounter (HOSPITAL_COMMUNITY): Payer: Self-pay

## 2021-03-27 ENCOUNTER — Emergency Department (HOSPITAL_COMMUNITY)
Admission: EM | Admit: 2021-03-27 | Discharge: 2021-03-28 | Disposition: A | Discharge status: Home / Self Care | Payer: Medicaid Other | Attending: Emergency Medicine | Admitting: Emergency Medicine

## 2021-03-27 ENCOUNTER — Other Ambulatory Visit: Payer: Self-pay

## 2021-03-27 DIAGNOSIS — J029 Acute pharyngitis, unspecified: Secondary | ICD-10-CM

## 2021-03-27 DIAGNOSIS — R059 Cough, unspecified: Secondary | ICD-10-CM | POA: Insufficient documentation

## 2021-03-27 DIAGNOSIS — R509 Fever, unspecified: Secondary | ICD-10-CM | POA: Insufficient documentation

## 2021-03-27 DIAGNOSIS — R5383 Other fatigue: Secondary | ICD-10-CM | POA: Insufficient documentation

## 2021-03-27 DIAGNOSIS — R519 Headache, unspecified: Secondary | ICD-10-CM | POA: Insufficient documentation

## 2021-03-27 DIAGNOSIS — Z87891 Personal history of nicotine dependence: Secondary | ICD-10-CM | POA: Insufficient documentation

## 2021-03-27 DIAGNOSIS — M7918 Myalgia, other site: Secondary | ICD-10-CM | POA: Insufficient documentation

## 2021-03-27 DIAGNOSIS — Z79899 Other long term (current) drug therapy: Secondary | ICD-10-CM | POA: Insufficient documentation

## 2021-03-27 NOTE — ED Triage Notes (Signed)
Pt reports with a sore throat for several days. Pt's tonsils are swollen and covered in white patches.

## 2021-03-28 LAB — GROUP A STREP BY PCR: Group A Strep by PCR: NOT DETECTED

## 2021-03-28 MED ORDER — PREDNISONE 10 MG PO TABS: 40.0000 mg | ORAL_TABLET | Freq: Every day | ORAL | 0 refills | Status: AC

## 2021-03-28 MED ORDER — LIDOCAINE VISCOUS HCL 2 % MT SOLN
15.0000 mL | Freq: Once | OROMUCOSAL | Status: AC
  Administered 2021-03-28: 04:00:00 15 mL via OROMUCOSAL
  Filled 2021-03-28: qty 15

## 2021-03-28 MED ORDER — DEXAMETHASONE SODIUM PHOSPHATE 10 MG/ML IJ SOLN
8.0000 mg | Freq: Once | INTRAMUSCULAR | Status: AC
  Administered 2021-03-28: 04:00:00 8 mg via INTRAMUSCULAR
  Filled 2021-03-28: qty 1

## 2021-03-28 NOTE — ED Provider Notes (Signed)
[facility]-EMERGENCY DEPT Provider Note   CSN: [id number] Arrival date & time: 03/27/21  2252     History Chief Complaint  Patient presents with   Sore Throat    Melissa Vang is a 35 y.o. female.  HPI Patient is a 35 year old female with a medical history as noted below.  Patient initially evaluated 4 days ago for symptoms consistent with a viral URI.  Respiratory panel was negative.  Patient states that her symptoms have persisted.  States she has a worsening sore throat.  Reports associated cough, body aches, fevers, myalgias, headaches, fatigue.  States her pain worsens with swallowing.  Notes intermittent nausea without vomiting.  States she works in [employer] and reports multiple possible sick contacts.    Past Medical History:  Diagnosis Date   Depression    Currently following with [facility] 04/2017   Panic attack    04/2017  currently followed at [facility]    Patient Active Problem List   Diagnosis Date Noted   Depression    Panic attack     Past Surgical History:  Procedure Laterality Date   MOUTH SURGERY     MOUTH SURGERY  age 8   for impacted tooth in hard palate     OB History   No obstetric history on file.     Family History  Problem Relation Age of Onset   Diabetes Mother    Arthritis Mother        disabled due to multiple joint and spine issues   Cancer Mother        "precancer of ovaries"   Diabetes Father     Social History   Tobacco Use   Smoking status: Former    Packs/day: 0.25    Years: 18.00    Pack years: 4.50    Types: Cigarettes   Smokeless tobacco: Never  Vaping Use   Vaping Use: Every day   Start date: 09/24/2016   Last attempt to quit: 03/26/2017   Substances: Nicotine  Substance Use Topics   Alcohol use: Yes    Comment: 1-2 drinks per week   Drug use: No    Home Medications Prior to Admission medications   Medication Sig Start Date End Date Taking? Authorizing Provider   predniSONE (DELTASONE) 10 MG tablet Take 4 tablets (40 mg total) by mouth daily with breakfast for 4 days. 03/28/21 04/01/21 Yes Placido Sou, PA-C  acetaminophen (TYLENOL) 325 MG tablet Take 650-1,300 mg by mouth every 6 (six) hours as needed for mild pain or headache. Patient not taking: Reported on 02/13/2021    [provider]  azithromycin (ZITHROMAX) 250 MG tablet Take 1 tablet (250 mg total) by mouth daily. Take 1 every day until finished. Patient not taking: No sig reported 07/20/17   Antony Madura, PA-C  famotidine (PEPCID) 20 MG tablet Take 1 tablet (20 mg total) by mouth 2 (two) times daily. Patient not taking: Reported on 02/13/2021 07/30/19   Dietrich Pates, PA-C  fexofenadine (ALLEGRA) 180 MG tablet Take 1 tablet (180 mg total) by mouth daily. Patient not taking: Reported on 02/13/2021 07/16/17   Julieanne Manson, MD  fluticasone [facility]) 50 MCG/ACT nasal spray Place 2 sprays into both nostrils daily. Patient not taking: Reported on 02/13/2021 07/16/17   Julieanne Manson, MD  hydrocortisone (ANUSOL-HC) 25 MG suppository Place 1 suppository (25 mg total) rectally 2 (two) times daily. For 7 days Patient not taking: Reported on 02/13/2021 02/02/21   Gilda Crease, MD  hydrOXYzine (VISTARIL) 25 MG capsule Take 25-50 mg by mouth 3 (three) times daily as needed for anxiety.    [provider]  ibuprofen (ADVIL) 200 MG tablet Take 400 mg by mouth every 6 (six) hours as needed for headache or moderate pain. Patient not taking: Reported on 02/13/2021    [provider]  ibuprofen (ADVIL,MOTRIN) 600 MG tablet Take 1 tablet (600 mg total) by mouth every 6 (six) hours as needed. Patient not taking: Reported on 03/11/2018 07/20/17   Antony Madura, PA-C  metoCLOPramide (REGLAN) 10 MG tablet Take 1 tablet (10 mg total) by mouth every 8 (eight) hours as needed for nausea. Patient not taking: Reported on 02/13/2021 11/28/20   Caccavale, Sophia, PA-C  Multiple Vitamin  (MULTIVITAMIN WITH MINERALS) TABS tablet Take 1 tablet by mouth daily. Patient not taking: Reported on 02/13/2021    [provider]  naproxen (NAPROSYN) 500 MG tablet Take 1 tablet (500 mg total) by mouth 2 (two) times daily with a meal. Patient not taking: Reported on 02/13/2021 11/28/20   Caccavale, Sophia, PA-C  ondansetron (ZOFRAN) 4 MG tablet Take 1 tablet (4 mg total) by mouth every 6 (six) hours. Patient not taking: Reported on 02/13/2021 02/16/19   Milagros Loll, MD  pantoprazole (PROTONIX) 40 MG tablet Take 1 tablet (40 mg total) by mouth daily. 02/14/21   Muthersbaugh, Dahlia Client, PA-C  phenol (CHLORASEPTIC) 1.4 % LIQD Use as directed 1 spray in the mouth or throat as needed for throat irritation / pain. Patient not taking: Reported on 02/13/2021 07/20/17   Antony Madura, PA-C  sertraline (ZOLOFT) 50 MG tablet Take 50 mg by mouth daily. Patient not taking: Reported on 02/13/2021    [provider]    Allergies    Amoxicillin, Penicillins, Morphine and related, and Molds & smuts  Review of Systems   Review of Systems  Constitutional:  Positive for chills, fatigue and fever.  HENT:  Positive for congestion and sore throat.   Respiratory:  Positive for cough. Negative for shortness of breath.   Cardiovascular:  Negative for chest pain.  Gastrointestinal:  Positive for nausea. Negative for abdominal pain, diarrhea and vomiting.   Physical Exam Updated Vital Signs BP (!) 152/92 (BP Location: Right Arm)    Pulse (!) 137    Temp 98.3 F (36.8 C) (Oral)    Resp 20    Ht 5\' 4"  (1.626 m)    Wt 72.6 kg    SpO2 99%    BMI 27.46 kg/m   Physical Exam Vitals and nursing note reviewed.  Constitutional:      General: She is not in acute distress.    Appearance: Normal appearance. She is not ill-appearing, toxic-appearing or diaphoretic.  HENT:     Head: Normocephalic and atraumatic.     Right Ear: External ear normal.     Left Ear: External ear normal.     Nose: Nose normal.      Mouth/Throat:     Mouth: Mucous membranes are moist.     Pharynx: Posterior oropharyngeal erythema present. No oropharyngeal exudate.     Tonsils: Tonsillar exudate present. No tonsillar abscesses. 1+ on the right. 1+ on the left.     Comments: Uvula midline.  Erythema noted in the posterior oropharynx.  Mild symmetric tonsillar hypertrophy noted.  Bilateral tonsillar exudates.  Readily handling secretions.  No hot potato voice.  No stridor. Eyes:     Extraocular Movements: Extraocular movements intact.  Neck:     Comments:  Anterior cervical lymphadenopathy.  Soft submental compartments. Cardiovascular:     Rate and Rhythm: Normal rate and regular rhythm.     Pulses: Normal pulses.     Heart sounds: Normal heart sounds. No murmur heard.   No friction rub. No gallop.  Pulmonary:     Effort: Pulmonary effort is normal. No respiratory distress.     Breath sounds: Normal breath sounds. No stridor. No wheezing, rhonchi or rales.  Abdominal:     General: Abdomen is flat.     Palpations: Abdomen is soft.     Tenderness: There is no abdominal tenderness.     Comments: Abdomen is soft and nontender.  Musculoskeletal:        General: Normal range of motion.     Cervical back: Normal range of motion and neck supple. No tenderness.  Lymphadenopathy:     Cervical: Cervical adenopathy present.  Skin:    General: Skin is warm and dry.  Neurological:     General: No focal deficit present.     Mental Status: She is alert and oriented to person, place, and time.  Psychiatric:        Mood and Affect: Mood normal.        Behavior: Behavior normal.    ED Results / Procedures / Treatments   Labs (all labs ordered are listed, but only abnormal results are displayed) Labs Reviewed  GROUP A STREP BY PCR   EKG None  Radiology No results found.  Procedures Procedures   Medications Ordered in ED Medications  lidocaine (XYLOCAINE) 2 % viscous mouth solution 15 mL (has no administration  in time range)  dexamethasone (DECADRON) injection 8 mg (8 mg Intramuscular Given 03/28/21 0350)   ED Course  I have reviewed the triage vital signs and the nursing notes.  Pertinent labs & imaging results that were available during my care of the patient were reviewed by me and considered in my medical decision making (see chart for details).    MDM Rules/Calculators/A&P                          Patient is a 35 year old female who presents to the emergency department with symptoms consistent with a viral pharyngitis.  Physical exam significant for erythema in the posterior oropharynx as well as bilateral tonsils.  Bilateral tonsillar exudates.  Mild symmetric tonsillar hypertrophy.  Uvula midline.  No hot potato voice.  No stridor.  Soft submental compartments.  Doubt Ludwig's angina or PTA at this time.  Group A strep by PCR was obtained in triage and is negative.  Patient initially evaluated 4 days ago and had a negative respiratory panel at that time.  Will treat patient's symptoms with IM Decadron as well as viscous lidocaine.  She states that she took Tylenol as well as ibuprofen just prior to being roomed.  Patient denies a history of diabetes mellitus.  Will discharge on a course of prednisone.  Feel the patient is stable for discharge at this time and she is agreeable.  Given strict return precautions.  Her questions were answered and she was amicable at the time of discharge.  Final Clinical Impression(s) / ED Diagnoses Final diagnoses:  Pharyngitis, unspecified etiology   Rx / DC Orders ED Discharge Orders          Ordered    predniSONE (DELTASONE) 10 MG tablet  Daily with breakfast        03/28/21 0349  Placido Sou, PA-C 03/28/21 0404    Paula Libra, MD 03/28/21 [phone]

## 2021-03-28 NOTE — Discharge Instructions (Addendum)
I am prescribing you a strong steroid medication called prednisone.  Please only take this as prescribed.  Please take it early in the morning, as this medication can be stimulating and make it difficult to sleep at night.  If you develop any new or worsening symptoms please do not hesitate to return to the emergency department. It was a pleasure to meet you.

## 2021-07-21 ENCOUNTER — Encounter (HOSPITAL_COMMUNITY): Payer: Self-pay

## 2021-07-21 ENCOUNTER — Emergency Department (HOSPITAL_COMMUNITY)
Admission: EM | Admit: 2021-07-21 | Discharge: 2021-07-21 | Disposition: A | Discharge status: Home / Self Care | Payer: Medicaid Other | Attending: Emergency Medicine | Admitting: Emergency Medicine

## 2021-07-21 ENCOUNTER — Other Ambulatory Visit: Payer: Self-pay

## 2021-07-21 ENCOUNTER — Ambulatory Visit (HOSPITAL_COMMUNITY)
Admission: EM | Admit: 2021-07-21 | Discharge: 2021-07-22 | Disposition: A | Discharge status: Home / Self Care | Payer: No Payment, Other | Attending: Physician Assistant | Admitting: Physician Assistant

## 2021-07-21 DIAGNOSIS — F411 Generalized anxiety disorder: Secondary | ICD-10-CM

## 2021-07-21 DIAGNOSIS — R0602 Shortness of breath: Secondary | ICD-10-CM | POA: Insufficient documentation

## 2021-07-21 DIAGNOSIS — R109 Unspecified abdominal pain: Secondary | ICD-10-CM | POA: Insufficient documentation

## 2021-07-21 DIAGNOSIS — F4310 Post-traumatic stress disorder, unspecified: Secondary | ICD-10-CM | POA: Insufficient documentation

## 2021-07-21 DIAGNOSIS — R0789 Other chest pain: Secondary | ICD-10-CM | POA: Insufficient documentation

## 2021-07-21 DIAGNOSIS — R519 Headache, unspecified: Secondary | ICD-10-CM | POA: Insufficient documentation

## 2021-07-21 DIAGNOSIS — F429 Obsessive-compulsive disorder, unspecified: Secondary | ICD-10-CM | POA: Insufficient documentation

## 2021-07-21 DIAGNOSIS — F333 Major depressive disorder, recurrent, severe with psychotic symptoms: Secondary | ICD-10-CM | POA: Insufficient documentation

## 2021-07-21 DIAGNOSIS — F419 Anxiety disorder, unspecified: Secondary | ICD-10-CM | POA: Insufficient documentation

## 2021-07-21 DIAGNOSIS — F410 Panic disorder [episodic paroxysmal anxiety] without agoraphobia: Secondary | ICD-10-CM | POA: Insufficient documentation

## 2021-07-21 HISTORY — DX: Anxiety disorder, unspecified: F41.9

## 2021-07-21 LAB — BASIC METABOLIC PANEL
Anion gap: 8 (ref 5–15)
BUN: 16 mg/dL (ref 6–20)
CO2: 26 mmol/L (ref 22–32)
Calcium: 9.3 mg/dL (ref 8.9–10.3)
Chloride: 106 mmol/L (ref 98–111)
Creatinine, Ser: 0.83 mg/dL (ref 0.44–1.00)
GFR, Estimated: >60 mL/min (ref >60)
Glucose, Bld: 108 mg/dL — ABNORMAL HIGH (ref 70–99)
Potassium: 3.5 mmol/L (ref 3.5–5.1)
Sodium: 140 mmol/L (ref 135–145)

## 2021-07-21 LAB — CBC WITH DIFFERENTIAL/PLATELET
Abs Immature Granulocytes: 0.01 10*3/uL (ref 0.00–0.07)
Basophils Absolute: 0 10*3/uL (ref 0.0–0.1)
Basophils Relative: 1 %
Eosinophils Absolute: 0.1 10*3/uL (ref 0.0–0.5)
Eosinophils Relative: 2 %
HCT: 41.8 % (ref 36.0–46.0)
Hemoglobin: 14.3 g/dL (ref 12.0–15.0)
Immature Granulocytes: 0 %
Lymphocytes Relative: 37 %
Lymphs Abs: 2 10*3/uL (ref 0.7–4.0)
MCH: 31.7 pg (ref 26.0–34.0)
MCHC: 34.2 g/dL (ref 30.0–36.0)
MCV: 92.7 fL (ref 80.0–100.0)
Monocytes Absolute: 0.4 10*3/uL (ref 0.1–1.0)
Monocytes Relative: 7 %
Neutro Abs: 2.9 10*3/uL (ref 1.7–7.7)
Neutrophils Relative %: 53 %
Platelets: 265 10*3/uL (ref 150–400)
RBC: 4.51 MIL/uL (ref 3.87–5.11)
RDW: 12.3 % (ref 11.5–15.5)
WBC: 5.4 10*3/uL (ref 4.0–10.5)
nRBC: 0 % (ref 0.0–0.2)

## 2021-07-21 LAB — I-STAT BETA HCG BLOOD, ED (MC, WL, AP ONLY): I-stat hCG, quantitative: <5 m[IU]/mL (ref <5)

## 2021-07-21 NOTE — ED Provider Notes (Signed)
?[facility] DEPT ?Provider Note ? ? ?CSN: [id number] ?Arrival date & time: 07/21/21  2033 ? ?  ? ?History ? ?Chief Complaint  ?Patient presents with  ? Anxiety  ? ? ?Melissa Vang is a 36 y.o. female. ? ?36 year old female with prior medical history as detailed below presents for evaluation.  Patient reports prior history of anxiety.  Patient reports intermittent symptoms ongoing for the last 1 to 2 weeks. ? ?Patient is concerned that her symptoms could be secondary to something other than anxiety. ? ?She complains of intermittent headache.  ? ?She complains of intermittent shortness of breath.  She complains of intermittent chest discomfort.  She denies nausea or vomiting. ? ? ? ?The history is provided by the patient and medical records.  ?Anxiety ?This is a chronic problem. The current episode started more than 1 week ago. The problem occurs daily. The problem has not changed since onset.Associated symptoms include chest pain, abdominal pain and headaches. Nothing aggravates the symptoms. Nothing relieves the symptoms.  ? ?  ? ?Home Medications ?Prior to Admission medications   ?Medication Sig Start Date End Date Taking? Authorizing Provider  ?acetaminophen (TYLENOL) 325 MG tablet Take 650-1,300 mg by mouth every 6 (six) hours as needed for mild pain or headache. ?Patient not taking: Reported on 02/13/2021    [provider]  ?azithromycin (ZITHROMAX) 250 MG tablet Take 1 tablet (250 mg total) by mouth daily. Take 1 every day until finished. ?Patient not taking: No sig reported 07/20/17   Antonietta Breach, PA-C  ?famotidine (PEPCID) 20 MG tablet Take 1 tablet (20 mg total) by mouth 2 (two) times daily. ?Patient not taking: Reported on 02/13/2021 07/30/19   Delia Heady, PA-C  ?fexofenadine (ALLEGRA) 180 MG tablet Take 1 tablet (180 mg total) by mouth daily. ?Patient not taking: Reported on 02/13/2021 07/16/17   Mack Hook, MD  ?fluticasone [facility]) 50 MCG/ACT nasal spray Place  2 sprays into both nostrils daily. ?Patient not taking: Reported on 02/13/2021 07/16/17   Mack Hook, MD  ?hydrocortisone (ANUSOL-HC) 25 MG suppository Place 1 suppository (25 mg total) rectally 2 (two) times daily. For 7 days ?Patient not taking: Reported on 02/13/2021 02/02/21   Orpah Greek, MD  ?hydrOXYzine (VISTARIL) 25 MG capsule Take 25-50 mg by mouth 3 (three) times daily as needed for anxiety.    [provider]  ?ibuprofen (ADVIL) 200 MG tablet Take 400 mg by mouth every 6 (six) hours as needed for headache or moderate pain. ?Patient not taking: Reported on 02/13/2021    [provider]  ?ibuprofen (ADVIL,MOTRIN) 600 MG tablet Take 1 tablet (600 mg total) by mouth every 6 (six) hours as needed. ?Patient not taking: Reported on 03/11/2018 07/20/17   Antonietta Breach, PA-C  ?metoCLOPramide (REGLAN) 10 MG tablet Take 1 tablet (10 mg total) by mouth every 8 (eight) hours as needed for nausea. ?Patient not taking: Reported on 02/13/2021 11/28/20   Franchot Heidelberg, PA-C  ?Multiple Vitamin (MULTIVITAMIN WITH MINERALS) TABS tablet Take 1 tablet by mouth daily. ?Patient not taking: Reported on 02/13/2021    [provider]  ?naproxen (NAPROSYN) 500 MG tablet Take 1 tablet (500 mg total) by mouth 2 (two) times daily with a meal. ?Patient not taking: Reported on 02/13/2021 11/28/20   Caccavale, Sophia, PA-C  ?ondansetron (ZOFRAN) 4 MG tablet Take 1 tablet (4 mg total) by mouth every 6 (six) hours. ?Patient not taking: Reported on 02/13/2021 02/16/19   Lucrezia Starch, MD  ?pantoprazole (PROTONIX) 40  MG tablet Take 1 tablet (40 mg total) by mouth daily. 02/14/21   Muthersbaugh, Jarrett Soho, PA-C  ?phenol (CHLORASEPTIC) 1.4 % LIQD Use as directed 1 spray in the mouth or throat as needed for throat irritation / pain. ?Patient not taking: Reported on 02/13/2021 07/20/17   Antonietta Breach, PA-C  ?sertraline (ZOLOFT) 50 MG tablet Take 50 mg by mouth daily. ?Patient not taking: Reported on 02/13/2021     [provider]  ?   ? ?Allergies    ?Amoxicillin, Penicillins, Morphine and related, and Molds & smuts   ? ?Review of Systems   ?Review of Systems  ?Cardiovascular:  Positive for chest pain.  ?Gastrointestinal:  Positive for abdominal pain.  ?Neurological:  Positive for headaches.  ?All other systems reviewed and are negative. ? ?Physical Exam ?Updated Vital Signs ?BP (!) 158/92 (BP Location: Left Arm)   Pulse 79   Temp 98 ?F (36.7 ?C) (Oral)   Resp 16   Ht 5\' 4"  (1.626 m)   Wt 61.7 kg   SpO2 95%   BMI 23.34 kg/m?  ?Physical Exam ?Vitals and nursing note reviewed.  ?Constitutional:   ?   General: She is not in acute distress. ?   Appearance: Normal appearance. She is well-developed.  ?HENT:  ?   Head: Normocephalic and atraumatic.  ?Eyes:  ?   Conjunctiva/sclera: Conjunctivae normal.  ?   Pupils: Pupils are equal, round, and reactive to light.  ?Cardiovascular:  ?   Rate and Rhythm: Normal rate and regular rhythm.  ?   Heart sounds: Normal heart sounds.  ?Pulmonary:  ?   Effort: Pulmonary effort is normal. No respiratory distress.  ?   Breath sounds: Normal breath sounds.  ?Abdominal:  ?   General: There is no distension.  ?   Palpations: Abdomen is soft.  ?   Tenderness: There is no abdominal tenderness.  ?Musculoskeletal:     ?   General: No deformity. Normal range of motion.  ?   Cervical back: Normal range of motion and neck supple.  ?Skin: ?   General: Skin is warm and dry.  ?Neurological:  ?   General: No focal deficit present.  ?   Mental Status: She is alert and oriented to person, place, and time.  ? ? ?ED Results / Procedures / Treatments   ?Labs ?(all labs ordered are listed, but only abnormal results are displayed) ?Labs Reviewed  ?BASIC METABOLIC PANEL  ?CBC WITH DIFFERENTIAL/PLATELET  ?I-STAT BETA HCG BLOOD, ED ([facility], [facility], [facility] ONLY)  ? ? ?EKG ?None ? ?Radiology ?No results found. ? ?Procedures ?Procedures  ? ? ?Medications Ordered in ED ?Medications - No data to display ? ?ED Course/  Medical Decision Making/ A&P ?  ?                        ?Medical Decision Making ?Amount and/or Complexity of Data Reviewed ?Labs: ordered. ? ? ? ?Medical Screen Complete ? ?This patient presented to the ED with complaint of multiple complaints including anxiety. ? ?This complaint involves an extensive number of treatment options. The initial differential diagnosis includes, but is not limited to, anemia, metabolic abnormality, etc. ? ?This presentation is: Chronic, Self-Limited, Previously Undiagnosed, Uncertain Prognosis, Complicated, Systemic Symptoms, and Threat to Life/Bodily Function ? ?Patient with multiple complaints including significant anxiety. ? ?Patient's physical complaints are most likely related to anxiety as reported by herself. ? ?Screening labs obtained are without significant abnormality. ? ?Patient is feeling improved  after reassuring work-up here in the ED. ? ?She does understand need for close outpatient follow-up.  Strict return precautions given and understood. ? ?Co morbidities that complicated the patient's evaluation ? ?History of anxiety ? ? ?Additional history obtained: ? ?External records from outside sources obtained and reviewed including prior ED visits and prior Inpatient records.  ? ? ?Lab Tests: ? ?I ordered and personally interpreted labs.  The pertinent results include: CBC, BMP, hCG ? ? ?Cardiac Monitoring: ? ?The patient was maintained on a cardiac monitor.  I personally viewed and interpreted the cardiac monitor which showed an underlying rhythm of: NSR ? ?Problem List / ED Course: ? ?Anxiety ? ? ?Reevaluation: ? ?After the interventions noted above, I reevaluated the patient and found that they have: improved ? ? ?Disposition: ? ?After consideration of the diagnostic results and the patients response to treatment, I feel that the patent would benefit from close outpatient follow-up.  ? ? ? ? ? ? ? ? ?Final Clinical Impression(s) / ED Diagnoses ?Final diagnoses:  ?Anxiety   ? ? ?Rx / DC Orders ?ED Discharge Orders   ? ? None  ? ?  ? ? ?  ?Valarie Merino, MD ?07/21/21 2239 ? ?

## 2021-07-21 NOTE — Discharge Instructions (Signed)
Return for any problem ? ?[facility] - [address] [phone] ?

## 2021-07-21 NOTE — ED Triage Notes (Signed)
Patient has had aching throbbing pains in her head. Numbness down her body. Tight stomach pain, bloated feeling. She thinks is could be related to anxiety.  ?

## 2021-07-22 MED ORDER — HYDROXYZINE PAMOATE 25 MG PO CAPS: 25.0000 mg | ORAL_CAPSULE | Freq: Three times a day (TID) | ORAL | 0 refills | Status: DC | PRN

## 2021-07-22 MED ORDER — SERTRALINE HCL 50 MG PO TABS: ORAL_TABLET | ORAL | 0 refills | Status: DC

## 2021-07-22 NOTE — Discharge Instructions (Signed)
Patient was provided resources for therapy and outpatient psychiatry following the conclusion of the encounter. ?

## 2021-07-22 NOTE — Progress Notes (Signed)
?   07/22/21 0147  ?[facility] Triage Screening (Walk-ins at [facility] only)  ?How Did You Hear About [place]? Self  ?What Is the Reason for Your Visit/Call Today? Melissa Vang is a 36 year old female presenting as a voluntary walk-in to [facility] due to anxiety. Patient was just seen at [facility] for anxiety with intermittent symptoms ongoing for the last 1-2 weeks, along with symptoms of headache, shortness of breast and chest discomfort. Patient denied SI, HI, psychosis and alcohol/drug usage. Patient stated, "I am a hypochondriac, my health is out of control, no details given. Patient reported I have a good job and and good apartment. Patient reported no depressive symptoms only increased anxiety due to possible health problems. Patient was calm and cooperative during assessment.  ?How Long Has This Been Causing You Problems? 1 wk - 1 month  ?Have You Recently Had Any Thoughts About Hurting Yourself? No  ?Are You Planning to Commit Suicide/Harm Yourself At This time? No  ?Are You Planning To Harm Someone At This Time? No  ?Are you currently experiencing any auditory, visual or other hallucinations? No  ?Have You Used Any Alcohol or Drugs in the Past 24 Hours? No  ?Do you have any current medical co-morbidities that require immediate attention? No  ?Clinician description of patient physical appearance/behavior: casual / cooperative  ?What Do You Feel Would Help You the Most Today? Treatment for Depression or other mood problem  ?If access to [facility] was not available, would you have sought care in the Emergency Department? Yes  ?Determination of Need Routine (7 days)  ?Options For Referral Medication Management;Outpatient Therapy  ? ? ?

## 2021-07-22 NOTE — ED Provider Notes (Signed)
Behavioral Health Urgent Care Medical Screening Exam ? ?Patient Name: Melissa Vang ?MRN: [id number] ?Date of Evaluation: 07/22/21 ?Chief Complaint:   ?Diagnosis:  ?Final diagnoses:  ?Generalized anxiety disorder  ? ? ?History of Present illness: Melissa Vang is a 36 y.o. female with a past psychiatric history significant for major depressive disorder, generalized anxiety disorder, OCD, and PTSD who presents to [facility] as a walk-in with a chief complaint for worsening anxiety. ? ?Patient reports that she has been dealing with major anxiety for years.  She reports that she was originally being seen at [facility] for psychiatric care until they lost her license.  Since then, patient states that her anxiety has been getting progressively worse.  Patient rates her anxiety at an 8 or 9 out of 10.  Patient's main stressor is that she just recently moved into the area at the end of February.  She reports that she is currently enjoying her job and her new apartment and denies any stressors around those factors.  She does admit to being a hypochondriac stating that she is constantly worried about her health.  Patient endorses panic attacks stating that she has had a several attacks in the last week.  Patient describes her panic attacks as feeling as though she is having a heart attack.  She reports that she recently called the ambulance a couple of weeks ago due to a major panic attack that she experienced. ? ?In addition to anxiety, patient endorses mild depression.  Patient depressive episodes are characterized by the following symptoms: low mood, decreased energy, lack of motivation, decreased concentration, feelings of guilt/worthlessness, hopelessness, and irritability.  Patient denies any factors related to her depression at this time.  She reports that she has used Zoloft in the management of her depressive symptoms.  Patient is currently taking hydroxyzine as needed for the  management of her anxiety but endorses minimal relief in the management of her anxiety.  Patient denies a past history of hospitalization due to mental health.  Patient denies a past history of suicide attempt nor does she endorse engaging in self-harm. ? ?Patient denies suicidal or homicidal ideations.  She endorses seldomly experiencing auditory hallucinations but denies experiencing both auditory or visual hallucinations at this time.  Patient does not appear to be responding to internal/external stimuli.  Patient endorses poor sleep stating that she does not get enough sleep.  Patient endorses decreased appetite stating that she mostly snacks through the day.  Patient endorses alcohol consumption occasionally.  Patient denies tobacco use or illicit drug use.  Patient denies feeling like a danger to herself and is able to contract for safety following the conclusion of the encounter. ? ?Psychiatric Specialty Exam ? ?Presentation  ?General Appearance:Appropriate for Environment; Casual ? ?Eye Contact:Good ? ?Speech:Clear and Coherent; Normal Rate ? ?Speech Volume:Normal ? ?Handedness:Right ? ? ?Mood and Affect  ?Mood:Anxious; Depressed ? ?Affect:Congruent ? ? ?Thought Process  ?Thought Processes:Coherent; Goal Directed ? ?Descriptions of Associations:Circumstantial ? ?Orientation:Full (Time, Place and Person) ? ?Thought Content:WDL; Logical ?   Hallucinations:None (Patient states that she very seldomly experiences auditory hallucinations) ?hears what sounds like mumbling about once a week ? ?Ideas of Reference:None ? ?Suicidal Thoughts:No ? ?Homicidal Thoughts:No ? ? ?Sensorium  ?Memory:Immediate Good; Recent Good; Remote Good ? ?Judgment:Good ? ?Insight:Good ? ? ?Executive Functions  ?Concentration:Good ? ?Attention Span:Good ? ?Recall:Good ? ?[employer] [place] ? ?Language:Good ? ? ?Psychomotor Activity  ?Psychomotor Activity:Normal ? ? ?Assets  ?Assets:Communication Skills; Financial Resources/Insurance;  Housing; Desire for Improvement; Vocational/Educational; Transportation; Social Support ? ? ?Sleep  ?Sleep:Fair ? ?Number of hours: No data recorded ? ?No data recorded ? ?Physical Exam: ?Physical Exam ?Psychiatric:     ?   Attention and Perception: Attention and perception normal. She does not perceive auditory or visual hallucinations.     ?   Mood and Affect: Affect normal. Mood is anxious and depressed.     ?   Speech: Speech normal.     ?   Behavior: Behavior normal. Behavior is cooperative.     ?   Thought Content: Thought content does not include homicidal or suicidal ideation. Thought content does not include suicidal plan.     ?   Cognition and Memory: Cognition and memory normal.     ?   Judgment: Judgment normal.  ? ?Review of Systems  ?Psychiatric/Behavioral:  Positive for depression. Negative for hallucinations, memory loss, substance abuse and suicidal ideas. The patient is nervous/anxious. The patient does not have insomnia.   ?Blood pressure (!) 143/82, pulse (!) 42, temperature 98 ?F (36.7 ?C), temperature source Oral, resp. rate 16, SpO2 99 %. There is no height or weight on file to calculate BMI. ? ?Musculoskeletal: ?Strength & Muscle Tone: within normal limits ?Gait & Station: normal ?Patient leans: N/A ? ? ?[facility] Discharge Disposition for Follow up and Recommendations: ?Based on my evaluation the patient does not appear to have an emergency medical condition and can be discharged with resources and follow up care in outpatient services for Medication Management. ? ?Patient denies suicidal or homicidal ideations.  She further denies auditory or visual hallucinations and does not appear to be responding to internal/external stimuli.  Patient denies being a danger to herself and is able to contract for safety prior to being discharged from this facility.  Patient to be prescribed Zoloft 25 mg for 4 days followed by 50 mg daily for the management of her anxiety and depressive symptoms.  Patient  to also be prescribed hydroxyzine 25 mg 3 times daily as needed for the management of her anxiety.  Patient was educated on potential side effects of medications prescribed to her.  Patient was provided walk-in hours for [facility] ? ?Provider reviewed patient with Dr. Lovette Cliche prior to patient being discharged from the facility. ? ?Malachy Mood, PA ?07/22/2021, 12:42 AM ? ?

## 2021-08-20 ENCOUNTER — Other Ambulatory Visit (HOSPITAL_COMMUNITY): Payer: Self-pay | Admitting: Physician Assistant

## 2021-08-22 NOTE — Telephone Encounter (Signed)
Patient needs an appointment before medication can be dispensed.

## 2021-09-05 ENCOUNTER — Other Ambulatory Visit (HOSPITAL_COMMUNITY): Payer: Self-pay | Admitting: Physician Assistant

## 2021-09-05 DIAGNOSIS — F339 Major depressive disorder, recurrent, unspecified: Secondary | ICD-10-CM

## 2021-09-05 MED ORDER — SERTRALINE HCL 50 MG PO TABS: 50.0000 mg | ORAL_TABLET | Freq: Every day | ORAL | 0 refills | Status: DC

## 2021-09-05 NOTE — Telephone Encounter (Signed)
Writer contacted patient per providers request. Patient was contacted and scheduled for a virtual follow-up on 6/1. Patient states she may come in sooner for walk-in to adjust meds.

## 2021-09-05 NOTE — Progress Notes (Signed)
Provider bridging patient's medication until next encounter.  Patient has an appointment scheduled for 09/13/2021.  Patient medication to be e-prescribed to pharmacy of choice.

## 2021-09-07 ENCOUNTER — Other Ambulatory Visit (HOSPITAL_COMMUNITY): Payer: Self-pay | Admitting: Physician Assistant

## 2021-09-07 DIAGNOSIS — F339 Major depressive disorder, recurrent, unspecified: Secondary | ICD-10-CM

## 2021-09-13 ENCOUNTER — Telehealth (HOSPITAL_COMMUNITY): Payer: HRSA Program | Admitting: Physician Assistant

## 2021-11-04 ENCOUNTER — Encounter (HOSPITAL_COMMUNITY): Payer: Self-pay | Admitting: Emergency Medicine

## 2021-11-04 ENCOUNTER — Emergency Department (HOSPITAL_COMMUNITY)
Admission: EM | Admit: 2021-11-04 | Discharge: 2021-11-04 | Disposition: A | Discharge status: Home / Self Care | Payer: Medicaid Other | Attending: Emergency Medicine | Admitting: Emergency Medicine

## 2021-11-04 ENCOUNTER — Other Ambulatory Visit: Payer: Self-pay

## 2021-11-04 DIAGNOSIS — R110 Nausea: Secondary | ICD-10-CM | POA: Insufficient documentation

## 2021-11-04 DIAGNOSIS — N3000 Acute cystitis without hematuria: Secondary | ICD-10-CM

## 2021-11-04 LAB — URINALYSIS, ROUTINE W REFLEX MICROSCOPIC
Bilirubin Urine: NEGATIVE
Glucose, UA: NEGATIVE mg/dL
Ketones, ur: NEGATIVE mg/dL
Nitrite: NEGATIVE
Protein, ur: 100 mg/dL — AB
RBC / HPF: >50 RBC/hpf — ABNORMAL HIGH (ref 0–5)
Specific Gravity, Urine: 1.016 (ref 1.005–1.030)
WBC, UA: >50 WBC/hpf — ABNORMAL HIGH (ref 0–5)
pH: 6 (ref 5.0–8.0)

## 2021-11-04 LAB — PREGNANCY, URINE: Preg Test, Ur: NEGATIVE

## 2021-11-04 MED ORDER — PHENAZOPYRIDINE HCL 95 MG PO TABS: 95.0000 mg | ORAL_TABLET | Freq: Three times a day (TID) | ORAL | 0 refills | Status: DC | PRN

## 2021-11-04 MED ORDER — NITROFURANTOIN MONOHYD MACRO 100 MG PO CAPS
100.0000 mg | ORAL_CAPSULE | Freq: Once | ORAL | Status: AC
  Administered 2021-11-04: 100 mg via ORAL
  Filled 2021-11-04: qty 1

## 2021-11-04 MED ORDER — NITROFURANTOIN MONOHYD MACRO 100 MG PO CAPS: 100.0000 mg | ORAL_CAPSULE | Freq: Two times a day (BID) | ORAL | 0 refills | Status: DC

## 2021-11-04 NOTE — ED Triage Notes (Signed)
Patient reports low back pain onset last week with dysuria and hematuria . Denies injury or fall .

## 2021-11-04 NOTE — ED Provider Notes (Signed)
[facility] Emergency Department Provider Note MRN:  [id number]  Arrival date & time: 11/04/21     Chief Complaint   Back Pain   History of Present Illness   Melissa Vang is a 36 y.o. year-old female presents to the ED with chief complaint of dysuria and hematuria.  She reports urinary frequency and urgency.  Has had slow progression of symptoms over the past week.  She states she is concerned that she might have a kidney stone.  She reports nausea, but without vomiting.  Denies fevers or chills.  History provided by patient.   Review of Systems  Pertinent review of systems noted in HPI.    Physical Exam   Vitals:   11/04/21 2157 11/04/21 2301  BP: 133/75 133/69  Pulse: 67 (!) 54  Resp: 16 16  Temp: 98 F (36.7 C) 98.4 F (36.9 C)  SpO2: 99% 99%    CONSTITUTIONAL:  well-appearing, NAD NEURO:  Alert and oriented x 3, CN 3-12 grossly intact EYES:  eyes equal and reactive ENT/NECK:  Supple, no stridor  CARDIO:  appears well-perfused  PULM:  No respiratory distress,  GI/GU:  non-distended, no CVA tenderness MSK/SPINE:  No gross deformities, no edema, moves all extremities  SKIN:  no rash, atraumatic   *Additional and/or pertinent findings included in MDM below  Diagnostic and Interventional Summary    EKG Interpretation  Date/Time:    Ventricular Rate:    PR Interval:    QRS Duration:   QT Interval:    QTC Calculation:   R Axis:     Text Interpretation:         Labs Reviewed  URINALYSIS, ROUTINE W REFLEX MICROSCOPIC - Abnormal; Notable for the following components:      Result Value   APPearance CLOUDY (*)    Hgb urine dipstick MODERATE (*)    Protein, ur 100 (*)    Leukocytes,Ua LARGE (*)    RBC / HPF >50 (*)    WBC, UA >50 (*)    Bacteria, UA FEW (*)    All other components within normal limits  PREGNANCY, URINE    No orders to display    Medications  nitrofurantoin (macrocrystal-monohydrate) (MACROBID) capsule 100  mg (100 mg Oral Given 11/04/21 2302)     Procedures  /  Critical Care Procedures  ED Course and Medical Decision Making  I have reviewed the triage vital signs, the nursing notes, and pertinent available records from the EMR.  Social Determinants Affecting Complexity of Care: Patient has no clinically significant social determinants affecting this chief complaint..   ED Course:   Patient here with dysuria.  Top differential diagnoses include UTI, pyelo, kidney stone. Medical Decision Making Patient here with dysuria, urgency, frequency, and some hematuria.  She is concerned about kidney stone, however with the slow development of the symptoms over the past week and will appearance of the patient, I think kidney stone is less likely.  More likely to be UTI, will check urinalysis and reassess at that point.  Urinalysis consistent with infection.  I think this is the cause of her symptoms.  We will start Macrobid.  She has penicillin allergy, so I will avoid cephalosporins as well.  We will also prescribe Pyridium.  Patient understands and agrees with plan.  Amount and/or Complexity of Data Reviewed Labs: ordered.    Details: Abnormal urinalysis consistent with UTI  Risk OTC drugs. Prescription drug management.     Consultants: No consultations were needed  in caring for this patient.   Treatment and Plan: Emergency department workup does not suggest an emergent condition requiring admission or immediate intervention beyond  what has been performed at this time. The patient is safe for discharge and has  been instructed to return immediately for worsening symptoms, change in  symptoms or any other concerns    Final Clinical Impressions(s) / ED Diagnoses     ICD-10-CM   1. Acute cystitis without hematuria  N30.00       ED Discharge Orders          Ordered    nitrofurantoin, macrocrystal-monohydrate, (MACROBID) 100 MG capsule  2 times daily        11/04/21 2255     phenazopyridine (PYRIDIUM) 95 MG tablet  3 times daily PRN        11/04/21 2255              Discharge Instructions Discussed with and Provided to Patient:    Discharge Instructions      Your pregnancy test was negative.  Your urinalysis is consistent with UTI.  Please take medications as prescribed.      Roxy Horseman, PA-C 11/04/21 2305    Charlynne Pander, MD 11/13/21 1010

## 2021-11-04 NOTE — Discharge Instructions (Addendum)
Your pregnancy test was negative.  Your urinalysis is consistent with UTI.  Please take medications as prescribed.

## 2021-11-04 NOTE — ED Provider Triage Note (Signed)
  Emergency Medicine Provider Triage Evaluation Note  MRN:  [id number]  Arrival date & time: 11/04/21    Medically screening exam initiated at 10:05 PM.   CC:   Back Pain   HPI:  Melissa Vang is a 36 y.o. year-old female presents to the ED with chief complaint of flank pain and dysuria for the past day or two.  Reports associated nausea but without vomiting.  She denies fever.  History provided by History provided by patient. ROS:  -As included in HPI PE:   Vitals:   11/04/21 2157  BP: 133/75  Pulse: 67  Resp: 16  Temp: 98 F (36.7 C)  SpO2: 99%    Non-toxic appearing No respiratory distress No CVA tenderness MDM:  Based on signs and symptoms, UTI is highest on my differential, followed by pyelo or KS. I've ordered ua in triage to expedite lab/diagnostic workup.  Patient was informed that the remainder of the evaluation will be completed by another provider, this initial triage assessment does not replace that evaluation, and the importance of remaining in the ED until their evaluation is complete.    Roxy Horseman, PA-C 11/04/21 2206

## 2021-11-07 ENCOUNTER — Emergency Department (HOSPITAL_COMMUNITY)
Admission: EM | Admit: 2021-11-07 | Discharge: 2021-11-08 | Disposition: A | Discharge status: Home / Self Care | Payer: Medicaid Other | Attending: Emergency Medicine | Admitting: Emergency Medicine

## 2021-11-07 ENCOUNTER — Encounter (HOSPITAL_COMMUNITY): Payer: Self-pay | Admitting: Emergency Medicine

## 2021-11-07 DIAGNOSIS — Z20822 Contact with and (suspected) exposure to covid-19: Secondary | ICD-10-CM | POA: Insufficient documentation

## 2021-11-07 DIAGNOSIS — J029 Acute pharyngitis, unspecified: Secondary | ICD-10-CM | POA: Insufficient documentation

## 2021-11-07 DIAGNOSIS — N390 Urinary tract infection, site not specified: Secondary | ICD-10-CM | POA: Insufficient documentation

## 2021-11-07 DIAGNOSIS — L299 Pruritus, unspecified: Secondary | ICD-10-CM | POA: Insufficient documentation

## 2021-11-07 LAB — GROUP A STREP BY PCR: Group A Strep by PCR: NOT DETECTED

## 2021-11-07 NOTE — ED Triage Notes (Addendum)
Patient complains of sore throat and diffuse itching after starting an antibiotic  (nitrofurantoin) for cystitis two days ago. Patient continues to complaint of itching and sore throat now. Patient is alert, speaking in complete sentences, and is in no apparent distress at this time.

## 2021-11-07 NOTE — ED Provider Triage Note (Signed)
Emergency Medicine Provider Triage Evaluation Note  Melissa Vang , a 36 y.o. female  was evaluated in triage.  Pt complains of sore throat. States that same began yesterday and has been persistent since then. States that she was recently started on an antibiotic Macrobid for UTI which she has been taking.  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  Denies any fevers or chills.  Review of Systems  Positive:  Negative:   Physical Exam  BP 117/77   Pulse 90   Temp 98.9 F (37.2 C) (Oral)   Resp 18   SpO2 99%  Gen:   Awake, no distress   Resp:  Normal effort  MSK:   Moves extremities without difficulty Other:  Trace tonsillar swelling with white patchy exudates.  No hives or rash present.  Medical Decision Making  Medically screening exam initiated at 7:16 PM.  Appropriate orders placed.  Melissa Vang was informed that the remainder of the evaluation will be completed by another provider, this initial triage assessment does not replace that evaluation, and the importance of remaining in the ED until their evaluation is complete.     Melissa Vang 11/07/21 1919

## 2021-11-08 LAB — RESP PANEL BY RT-PCR (FLU A&B, COVID) ARPGX2
Influenza A by PCR: NEGATIVE
Influenza B by PCR: NEGATIVE
SARS Coronavirus 2 by RT PCR: NEGATIVE

## 2021-11-08 MED ORDER — FOSFOMYCIN TROMETHAMINE 3 G PO PACK
3.0000 g | PACK | Freq: Once | ORAL | Status: AC
  Administered 2021-11-08: 3 g via ORAL
  Filled 2021-11-08: qty 3

## 2021-11-08 NOTE — ED Provider Notes (Signed)
[facility] EMERGENCY DEPARTMENT Provider Note   CSN: [id number] Arrival date & time: 11/07/21  1819     History  Chief Complaint  Patient presents with   Sore Throat    Melissa Vang is a 36 y.o. female with a medical history of depression, panic attack, and anxiety.  Presents emergency department for complaint of sore throat, generalized body aches, and pruritus.  Patient reports that she began having generalized pruritus after starting Macrobid for urinary tract infection.  Patient reports that she has been taking Benadryl with no improvement in her pruritus.  Patient denies any associated facial swelling, trouble swallowing, trouble breathing, or rash.  Patient did not take her Macrobid medication today and did not have any pruritus.  Patient was that she has had improvement in her dysuria however she is still having some at this time.  Patient also reports that yesterday she started having a sore throat, generalized body aches, minimally productive cough, and headache.  When producing mucus patient reports that as clear.  Headache onset is gradual pain grossly worse over time.  Pain is located throughout entire head.  Patient denies any known sick contacts.  Patient has been vaccinated for COVID-19 and influenza.   Sore Throat Associated symptoms include headaches. Pertinent negatives include no chest pain, no abdominal pain and no shortness of breath.       Home Medications Prior to Admission medications   Medication Sig Start Date End Date Taking? Authorizing Provider  acetaminophen (TYLENOL) 325 MG tablet Take 650-1,300 mg by mouth every 6 (six) hours as needed for mild pain or headache. Patient not taking: Reported on 02/13/2021    [provider]  azithromycin (ZITHROMAX) 250 MG tablet Take 1 tablet (250 mg total) by mouth daily. Take 1 every day until finished. Patient not taking: No sig reported 07/20/17   Antony Madura, PA-C  famotidine (PEPCID)  20 MG tablet Take 1 tablet (20 mg total) by mouth 2 (two) times daily. Patient not taking: Reported on 02/13/2021 07/30/19   Dietrich Pates, PA-C  fexofenadine (ALLEGRA) 180 MG tablet Take 1 tablet (180 mg total) by mouth daily. Patient not taking: Reported on 02/13/2021 07/16/17   Julieanne Manson, MD  fluticasone [facility] 50 MCG/ACT nasal spray Place 2 sprays into both nostrils daily. Patient not taking: Reported on 02/13/2021 07/16/17   Julieanne Manson, MD  hydrocortisone (ANUSOL-HC) 25 MG suppository Place 1 suppository (25 mg total) rectally 2 (two) times daily. For 7 days Patient not taking: Reported on 02/13/2021 02/02/21   Gilda Crease, MD  hydrOXYzine (VISTARIL) 25 MG capsule Take 1-2 capsules (25-50 mg total) by mouth 3 (three) times daily as needed for anxiety. 07/22/21   Nwoko, Tommas Olp, PA  ibuprofen (ADVIL) 200 MG tablet Take 400 mg by mouth every 6 (six) hours as needed for headache or moderate pain. Patient not taking: Reported on 02/13/2021    [provider]  ibuprofen (ADVIL,MOTRIN) 600 MG tablet Take 1 tablet (600 mg total) by mouth every 6 (six) hours as needed. Patient not taking: Reported on 03/11/2018 07/20/17   Antony Madura, PA-C  metoCLOPramide (REGLAN) 10 MG tablet Take 1 tablet (10 mg total) by mouth every 8 (eight) hours as needed for nausea. Patient not taking: Reported on 02/13/2021 11/28/20   Caccavale, Sophia, PA-C  Multiple Vitamin (MULTIVITAMIN WITH MINERALS) TABS tablet Take 1 tablet by mouth daily. Patient not taking: Reported on 02/13/2021    [provider]  naproxen (NAPROSYN) 500 MG tablet  Take 1 tablet (500 mg total) by mouth 2 (two) times daily with a meal. Patient not taking: Reported on 02/13/2021 11/28/20   Caccavale, Sophia, PA-C  nitrofurantoin, macrocrystal-monohydrate, (MACROBID) 100 MG capsule Take 1 capsule (100 mg total) by mouth 2 (two) times daily. 11/04/21   Roxy Horseman, PA-C  ondansetron (ZOFRAN) 4 MG tablet Take 1  tablet (4 mg total) by mouth every 6 (six) hours. Patient not taking: Reported on 02/13/2021 02/16/19   Milagros Loll, MD  pantoprazole (PROTONIX) 40 MG tablet Take 1 tablet (40 mg total) by mouth daily. 02/14/21   Muthersbaugh, Dahlia Client, PA-C  phenazopyridine (PYRIDIUM) 95 MG tablet Take 1 tablet (95 mg total) by mouth 3 (three) times daily as needed for pain. 11/04/21   Roxy Horseman, PA-C  phenol (CHLORASEPTIC) 1.4 % LIQD Use as directed 1 spray in the mouth or throat as needed for throat irritation / pain. Patient not taking: Reported on 02/13/2021 07/20/17   Antony Madura, PA-C  sertraline (ZOLOFT) 50 MG tablet TAKE 1 TABLET BY MOUTH EVERY DAY 09/07/21   Nwoko, Tommas Olp, PA      Allergies    Amoxicillin, Penicillins, Morphine and related, and Molds & smuts    Review of Systems   Review of Systems  Constitutional:  Positive for chills. Negative for fever.  HENT:  Positive for sore throat. Negative for congestion, drooling, facial swelling, rhinorrhea, trouble swallowing and voice change.   Eyes:  Negative for visual disturbance.  Respiratory:  Positive for cough. Negative for shortness of breath.   Cardiovascular:  Negative for chest pain.  Gastrointestinal:  Negative for abdominal pain, nausea and vomiting.  Genitourinary:  Positive for dysuria. Negative for difficulty urinating, frequency and urgency.  Musculoskeletal:  Negative for back pain, neck pain and neck stiffness.  Skin:  Negative for color change and rash.  Neurological:  Positive for headaches. Negative for dizziness, tremors, seizures, syncope, facial asymmetry, speech difficulty, weakness, light-headedness and numbness.  Psychiatric/Behavioral:  Negative for confusion.     Physical Exam Updated Vital Signs BP 117/77   Pulse 90   Temp 98.9 F (37.2 C) (Oral)   Resp 18   SpO2 99%  Physical Exam Vitals and nursing note reviewed.  Constitutional:      General: She is not in acute distress.    Appearance: She is not  ill-appearing, toxic-appearing or diaphoretic.  HENT:     Head: Normocephalic.     Mouth/Throat:     Lips: Pink. No lesions.     Mouth: No angioedema.     Tongue: No lesions. Tongue does not deviate from midline.     Palate: No mass and lesions.     Pharynx: Oropharynx is clear. Uvula midline. Posterior oropharyngeal erythema present. No pharyngeal swelling, oropharyngeal exudate or uvula swelling.     Tonsils: No tonsillar exudate or tonsillar abscesses. 1+ on the right. 1+ on the left.     Comments: Erythema to posterior oropharynx and tonsils bilaterally.  No swelling to floor of mouth.  Handles oral secretions without difficulty.  No hot potato voice. Eyes:     General: No scleral icterus.       Right eye: No discharge.        Left eye: No discharge.  Neck:     Comments: No swelling to submandibular space Cardiovascular:     Rate and Rhythm: Normal rate.  Pulmonary:     Effort: Pulmonary effort is normal. No tachypnea, bradypnea or respiratory distress.  Breath sounds: Normal breath sounds. No stridor.  Musculoskeletal:     Cervical back: Full passive range of motion without pain, normal range of motion and neck supple. No edema, erythema, signs of trauma, rigidity, torticollis or crepitus. No pain with movement, spinous process tenderness or muscular tenderness. Normal range of motion.  Skin:    General: Skin is warm and dry.     Findings: No rash.  Neurological:     General: No focal deficit present.     Mental Status: She is alert.  Psychiatric:        Behavior: Behavior is cooperative.     ED Results / Procedures / Treatments   Labs (all labs ordered are listed, but only abnormal results are displayed) Labs Reviewed  GROUP A STREP BY PCR  RESP PANEL BY RT-PCR (FLU A&B, COVID) [id number]    EKG None  Radiology No results found.  Procedures Procedures    Medications Ordered in ED Medications - No data to display  ED Course/ Medical Decision Making/  A&P Clinical Course as of 11/08/21 0140  Thu Nov 08, 2021  0027 I spoke with pharmacist Fayrene Fearing regarding changing antibiotics.  He agrees with single dose of fosfomycin here in the emergency department.   [PB]    Clinical Course User Index [PB] Haskel Schroeder, PA-C                           Medical Decision Making Risk Prescription drug management.   Alert 36 year old female no acute distress, nontoxic-appearing.  Presents to the ED with a complaint of sore throat, generalized body aches, and pruritus.  Information was obtained from patient and patient's significant other at bedside.  I reviewed patient's past medical records including previous prior notes, labs, and imaging.  Patient has medical history as outlined in HPI which complicates her care.  Per chart review patient was seen on 7/23 for dysuria and hematuria.  Patient was started on Macrobid for urinary tract infection.  With patient reporting generalized pruritus after taking Macrobid concern for possible allergic reaction.  Patient has anaphylactic allergy to penicillin and Augmentin.  I discussed antibiotic options with pharmacist.  We will give patient single dose of fosfomycin here in the emergency department for UTI.  Patient able to tolerate fosfomycin without any incident.  Patient had reports of sore throat and was swabbed for group A strep in triage.  Patient has no exudate to tonsils or posterior oropharynx.  Patient has no swelling to submandibular space or floor of mouth.  Patient has full range of motion to neck with no pain.  Low suspicion for Ludwig angina or deep space neck infection at this time.  Based on patient's constellation of symptoms concern for possible COVID-19.  We will swab patient for COVID-19 at this time.  Patient to follow her results on [facility] MyChart.  Discussed isolation precautions and symptomatic treatment.  I personally viewed interpret patient's lab results.  Pertinent findings  include negative for group A strep.  Based on patient's chief complaint, I considered admission might be necessary, however after reassuring ED workup feel patient is reasonable for discharge.  Discussed results, findings, treatment and follow up. Patient advised of return precautions. Patient verbalized understanding and agreed with plan.  Portions of this note were generated with Scientist, clinical (histocompatibility and immunogenetics). Dictation errors may occur despite best attempts at proofreading.         Final Clinical Impression(s) / ED Diagnoses  Final diagnoses:  Sore throat  Urinary tract infection without hematuria, site unspecified    Rx / DC Orders ED Discharge Orders     None         Berneice Heinrich 11/08/21 0147    Dione Booze, MD 11/08/21 [phone]

## 2021-11-08 NOTE — Discharge Instructions (Addendum)
You came to the emergency department today with reports of Covid-19 like symptoms.  These symptoms may be due to Covid 19 or another viral illness.  You have a COVID test pending. Please isolate at home while awaiting your results.  You can find results on your [facility] MyChart. > If your test is negative, stay home until your fever has resolved/your symptoms are improving. > If your test is positive, isolate at home for at least 5 days after the day your symptoms initially began, and THEN at least 24 hours after you are fever-free without the help of medications (Tylenol/acetaminophen and Advil/ibuprofen/Motrin) AND your symptoms are improving.  You can alternate Tylenol/acetaminophen and Advil/ibuprofen/Motrin every 4 hours for sore throat, body aches, headache or fever.  Drink plenty of water.  Use saline nasal spray for congestion. Wash your hands frequently. Please rest as needed with frequent repositioning and ambulation as tolerated.    If you use a CPAP or BiPAP device for management of obstructive sleep apnea may continue to use it however use it when isolated from other individuals to avoid spread of COVID-19.   If you use a nebulizer administer medication such as albuterol you may continue to use it however only one isolated from other individuals to avoid the spread of COVID-19.  If your symptoms do not improve please follow-up with your primary care provider or urgent care.  Return to the ER for significant shortness of breath, uncontrollable vomiting, severe chest pain, inability to tolerate fluids, changes in mental status such as confusion or other concerning symptoms.

## 2022-04-01 ENCOUNTER — Encounter (HOSPITAL_COMMUNITY): Payer: Self-pay | Admitting: Obstetrics & Gynecology

## 2022-04-01 ENCOUNTER — Inpatient Hospital Stay (HOSPITAL_COMMUNITY)
Admission: AD | Admit: 2022-04-01 | Discharge: 2022-04-01 | Disposition: A | Discharge status: Home / Self Care | Payer: Medicaid Other | Attending: Obstetrics & Gynecology | Admitting: Obstetrics & Gynecology

## 2022-04-01 ENCOUNTER — Other Ambulatory Visit: Payer: Self-pay

## 2022-04-01 DIAGNOSIS — R112 Nausea with vomiting, unspecified: Secondary | ICD-10-CM | POA: Diagnosis not present

## 2022-04-01 DIAGNOSIS — O219 Vomiting of pregnancy, unspecified: Secondary | ICD-10-CM | POA: Diagnosis not present

## 2022-04-01 DIAGNOSIS — Z3201 Encounter for pregnancy test, result positive: Secondary | ICD-10-CM | POA: Insufficient documentation

## 2022-04-01 DIAGNOSIS — Z3A01 Less than 8 weeks gestation of pregnancy: Secondary | ICD-10-CM | POA: Insufficient documentation

## 2022-04-01 LAB — URINALYSIS, ROUTINE W REFLEX MICROSCOPIC
Bilirubin Urine: NEGATIVE
Glucose, UA: NEGATIVE mg/dL
Hgb urine dipstick: NEGATIVE
Ketones, ur: NEGATIVE mg/dL
Leukocytes,Ua: NEGATIVE
Nitrite: NEGATIVE
Protein, ur: NEGATIVE mg/dL
Specific Gravity, Urine: 1.005 (ref 1.005–1.030)
pH: 6 (ref 5.0–8.0)

## 2022-04-01 LAB — POCT PREGNANCY, URINE: Preg Test, Ur: POSITIVE — AB

## 2022-04-01 MED ORDER — ONDANSETRON 4 MG PO TBDP: 4.0000 mg | ORAL_TABLET | Freq: Four times a day (QID) | ORAL | 0 refills | Status: DC | PRN

## 2022-04-01 MED ORDER — PROMETHAZINE HCL 25 MG PO TABS: 25.0000 mg | ORAL_TABLET | Freq: Four times a day (QID) | ORAL | 0 refills | Status: DC | PRN

## 2022-04-01 NOTE — MAU Provider Note (Signed)
History     CSN: [id number]  Arrival date and time: 04/01/22 1616   Event Date/Time   First Provider Initiated Contact with Patient 04/01/22 1734      Chief Complaint  Patient presents with   Nausea   Emesis   HPI Melissa Vang is a 36 y.o. G1P0 in first trimester who presents to MAU with chief complaint nausea and vomiting. These are recurrent problems, onset almost 4 weeks ago. She does not have access to antiemetics. She is unable to tolerate anything PO.  Patient is intermittently tearful due to "feeling awful".  She is planning elective termination and has a initial appointment for this on 04/03/2022.  OB History     Gravida  1   Para      Term      Preterm      AB      Living         SAB      IAB      Ectopic      Multiple      Live Births              Past Medical History:  Diagnosis Date   Anxiety    Depression    Currently following with [facility] 04/2017   Panic attack    04/2017  currently followed at [facility]    Past Surgical History:  Procedure Laterality Date   MOUTH SURGERY     MOUTH SURGERY  age 1   for impacted tooth in hard palate    Family History  Problem Relation Age of Onset   Diabetes Mother    Arthritis Mother        disabled due to multiple joint and spine issues   Cancer Mother        "precancer of ovaries"   Diabetes Father     Social History   Tobacco Use   Smoking status: Former    Packs/day: 0.25    Years: 18.00    Total pack years: 4.50    Types: Cigarettes   Smokeless tobacco: Never  Vaping Use   Vaping Use: Every day   Start date: 09/24/2016   Last attempt to quit: 03/26/2017   Substances: Nicotine  Substance Use Topics   Alcohol use: Yes    Comment: 1-2 drinks per week   Drug use: No    Allergies:  Allergies  Allergen Reactions   Amoxicillin Anaphylaxis, Swelling and Other (See Comments)    Has patient had a PCN reaction causing immediate rash, facial/tongue/throat swelling, SOB or  lightheadedness with hypotension: Yes Has patient had a PCN reaction causing severe rash involving mucus membranes or skin necrosis: Yes Has patient had a PCN reaction that required hospitalization: Unknown Has patient had a PCN reaction occurring within the last 10 years: No If all of the above answers are "NO", then may proceed with Cephalosporin use.  Other reaction(s):  ([id number])   Penicillins Anaphylaxis, Swelling and Other (See Comments)    Childhood allergy/Throat swelling in particular Has patient had a PCN reaction causing immediate rash, facial/tongue/throat swelling, SOB or lightheadedness with hypotension: Yes Has patient had a PCN reaction causing severe rash involving mucus membranes or skin necrosis: Yes Has patient had a PCN reaction that required hospitalization: Yes Has patient had a PCN reaction occurring within the last 10 years: No If all of the above answers are "NO", then may proceed with Cephalosporin use.    Morphine And Related Itching, Rash and Other (  See Comments)    "Felt like my skin was burning- instant fire where injected"   Molds & Smuts Other (See Comments)    "Fuzzy feeling in my throat"    Medications Prior to Admission  Medication Sig Dispense Refill Last Dose   acetaminophen (TYLENOL) 325 MG tablet Take 650-1,300 mg by mouth every 6 (six) hours as needed for mild pain or headache. (Patient not taking: Reported on 02/13/2021)      azithromycin (ZITHROMAX) 250 MG tablet Take 1 tablet (250 mg total) by mouth daily. Take 1 every day until finished. (Patient not taking: No sig reported) 4 tablet 0    famotidine (PEPCID) 20 MG tablet Take 1 tablet (20 mg total) by mouth 2 (two) times daily. (Patient not taking: Reported on 02/13/2021) 30 tablet 0    fexofenadine (ALLEGRA) 180 MG tablet Take 1 tablet (180 mg total) by mouth daily. (Patient not taking: Reported on 02/13/2021)      fluticasone (FLONASE) 50 MCG/ACT nasal spray Place 2 sprays into both nostrils  daily. (Patient not taking: Reported on 02/13/2021) 16 g 6    hydrocortisone (ANUSOL-HC) 25 MG suppository Place 1 suppository (25 mg total) rectally 2 (two) times daily. For 7 days (Patient not taking: Reported on 02/13/2021) 14 suppository 0    hydrOXYzine (VISTARIL) 25 MG capsule Take 1-2 capsules (25-50 mg total) by mouth 3 (three) times daily as needed for anxiety. 30 capsule 0    ibuprofen (ADVIL) 200 MG tablet Take 400 mg by mouth every 6 (six) hours as needed for headache or moderate pain. (Patient not taking: Reported on 02/13/2021)      ibuprofen (ADVIL,MOTRIN) 600 MG tablet Take 1 tablet (600 mg total) by mouth every 6 (six) hours as needed. (Patient not taking: Reported on 03/11/2018) 30 tablet 0    metoCLOPramide (REGLAN) 10 MG tablet Take 1 tablet (10 mg total) by mouth every 8 (eight) hours as needed for nausea. (Patient not taking: Reported on 02/13/2021) 15 tablet 0    Multiple Vitamin (MULTIVITAMIN WITH MINERALS) TABS tablet Take 1 tablet by mouth daily. (Patient not taking: Reported on 02/13/2021)      naproxen (NAPROSYN) 500 MG tablet Take 1 tablet (500 mg total) by mouth 2 (two) times daily with a meal. (Patient not taking: Reported on 02/13/2021) 15 tablet 0    ondansetron (ZOFRAN) 4 MG tablet Take 1 tablet (4 mg total) by mouth every 6 (six) hours. (Patient not taking: Reported on 02/13/2021) 12 tablet 0    pantoprazole (PROTONIX) 40 MG tablet Take 1 tablet (40 mg total) by mouth daily. 30 tablet 0    phenazopyridine (PYRIDIUM) 95 MG tablet Take 1 tablet (95 mg total) by mouth 3 (three) times daily as needed for pain. 10 tablet 0    phenol (CHLORASEPTIC) 1.4 % LIQD Use as directed 1 spray in the mouth or throat as needed for throat irritation / pain. (Patient not taking: Reported on 02/13/2021) 1 Bottle 0    sertraline (ZOLOFT) 50 MG tablet TAKE 1 TABLET BY MOUTH EVERY DAY 90 tablet 1     Review of Systems  Constitutional:  Positive for fatigue.  Gastrointestinal:  Positive for nausea  and vomiting.  All other systems reviewed and are negative.  Physical Exam   Blood pressure 107/65, pulse 63, temperature 98.1 F (36.7 C), temperature source Oral, resp. rate 19, height 5\' 4"  (1.626 m), weight 62.8 kg, last menstrual period 02/06/2022, SpO2 100 %.  Physical Exam Constitutional:  General: She is not in acute distress.    Appearance: She is not ill-appearing.  Cardiovascular:     Rate and Rhythm: Normal rate.     Pulses: Normal pulses.  Pulmonary:     Effort: Pulmonary effort is normal.  Abdominal:     General: Abdomen is flat.  Skin:    Capillary Refill: Capillary refill takes less than 2 seconds.  Neurological:     Mental Status: She is oriented to person, place, and time.  Psychiatric:        Mood and Affect: Mood normal.        Behavior: Behavior normal.        Thought Content: Thought content normal.        Judgment: Judgment normal.     MAU Course  Procedures  MDM --Patient generally well-appearing. Will defer lab work until results of UA are obtained  Orders Placed This Encounter  Procedures   Urinalysis, Routine w reflex microscopic Urine, Clean Catch   Pregnancy, urine POC   Discharge patient   Vitals:   04/01/22 1627 04/01/22 1632  BP:  107/65  Pulse:  63  Temp:  98.1 F (36.7 C)  Resp:  19  Height: 5\' 4"  (1.626 m)   Weight: 62.8 kg   SpO2:  100%  TempSrc:  Oral  BMI (Calculated): 23.74    Results for orders placed or performed during the hospital encounter of 04/01/22 (from the past 72 hour(s))  Urinalysis, Routine w reflex microscopic Urine, Clean Catch     Status: None   Collection Time: 04/01/22  4:53 PM  Result Value Ref Range   Color, Urine YELLOW YELLOW   APPearance CLEAR CLEAR   Specific Gravity, Urine 1.005 1.005 - 1.030   pH 6.0 5.0 - 8.0   Glucose, UA NEGATIVE NEGATIVE mg/dL   Hgb urine dipstick NEGATIVE NEGATIVE   Bilirubin Urine NEGATIVE NEGATIVE   Ketones, ur NEGATIVE NEGATIVE mg/dL   Protein, ur NEGATIVE  NEGATIVE mg/dL   Nitrite NEGATIVE NEGATIVE   Leukocytes,Ua NEGATIVE NEGATIVE    Comment: Performed at [facility], [address], [place], [place]  Pregnancy, urine POC     Status: Abnormal   Collection Time: 04/01/22  4:55 PM  Result Value Ref Range   Preg Test, Ur POSITIVE (A) NEGATIVE    Comment:        THE SENSITIVITY OF THIS METHODOLOGY IS >24 mIU/mL     Meds ordered this encounter  Medications   promethazine (PHENERGAN) 25 MG tablet    Sig: Take 1 tablet (25 mg total) by mouth every 6 (six) hours as needed for nausea or vomiting.    Dispense:  30 tablet    Refill:  0    Order Specific Question:   Supervising Provider    Answer:   04/03/22 A [[id number]]   ondansetron (ZOFRAN-ODT) 4 MG disintegrating tablet    Sig: Take 1 tablet (4 mg total) by mouth every 6 (six) hours as needed for nausea.    Dispense:  20 tablet    Refill:  0    Order Specific Question:   Supervising Provider    Answer:   Jaynie Collins A [[id number]]   Assessment and Plan  --36 y.o. G1P0 in first trimester --Vomiting without ketonuria --New antiemetic regimen as needed --Discharge home in stable condition with return precautions  F/U: --Planning elective termination, follow-up coordination declined  31, MSA, MSN, CNM

## 2022-04-01 NOTE — MAU Note (Signed)
Melissa Vang is a 36 y.o. at Unknown here in MAU reporting: she's 7-[redacted] weeks pregnant and experiencing N/V.  Reports she's able to keep both solids & liquids down, but not eating much. LMP: 02/06/2022 Onset of complaint: 3-4 weeks Pain score: 0 Vitals:   04/01/22 1632  BP: 107/65  Pulse: 63  Resp: 19  Temp: 98.1 F (36.7 C)  SpO2: 100%     FHT:NA Lab orders placed from triage:   UPT

## 2022-04-15 ENCOUNTER — Other Ambulatory Visit: Payer: Self-pay

## 2022-04-15 ENCOUNTER — Encounter (HOSPITAL_COMMUNITY): Payer: Self-pay | Admitting: Pharmacy Technician

## 2022-04-15 ENCOUNTER — Inpatient Hospital Stay (HOSPITAL_COMMUNITY)
Admission: AD | Admit: 2022-04-15 | Discharge: 2022-04-15 | Disposition: A | Discharge status: Home / Self Care | Payer: Medicaid Other | Attending: Obstetrics and Gynecology | Admitting: Obstetrics and Gynecology

## 2022-04-15 ENCOUNTER — Inpatient Hospital Stay (HOSPITAL_COMMUNITY): Payer: Medicaid Other

## 2022-04-15 DIAGNOSIS — Z3A08 8 weeks gestation of pregnancy: Secondary | ICD-10-CM | POA: Diagnosis not present

## 2022-04-15 DIAGNOSIS — B3731 Acute candidiasis of vulva and vagina: Secondary | ICD-10-CM | POA: Diagnosis not present

## 2022-04-15 DIAGNOSIS — O468X1 Other antepartum hemorrhage, first trimester: Secondary | ICD-10-CM

## 2022-04-15 DIAGNOSIS — R109 Unspecified abdominal pain: Secondary | ICD-10-CM | POA: Diagnosis present

## 2022-04-15 DIAGNOSIS — K5900 Constipation, unspecified: Secondary | ICD-10-CM | POA: Diagnosis not present

## 2022-04-15 DIAGNOSIS — O208 Other hemorrhage in early pregnancy: Secondary | ICD-10-CM | POA: Diagnosis not present

## 2022-04-15 DIAGNOSIS — N8312 Corpus luteum cyst of left ovary: Secondary | ICD-10-CM | POA: Insufficient documentation

## 2022-04-15 DIAGNOSIS — O3481 Maternal care for other abnormalities of pelvic organs, first trimester: Secondary | ICD-10-CM | POA: Insufficient documentation

## 2022-04-15 DIAGNOSIS — O99611 Diseases of the digestive system complicating pregnancy, first trimester: Secondary | ICD-10-CM | POA: Insufficient documentation

## 2022-04-15 DIAGNOSIS — O418X10 Other specified disorders of amniotic fluid and membranes, first trimester, not applicable or unspecified: Secondary | ICD-10-CM

## 2022-04-15 DIAGNOSIS — O26891 Other specified pregnancy related conditions, first trimester: Secondary | ICD-10-CM | POA: Insufficient documentation

## 2022-04-15 DIAGNOSIS — O23592 Infection of other part of genital tract in pregnancy, second trimester: Secondary | ICD-10-CM | POA: Diagnosis not present

## 2022-04-15 DIAGNOSIS — O98811 Other maternal infectious and parasitic diseases complicating pregnancy, first trimester: Secondary | ICD-10-CM | POA: Diagnosis not present

## 2022-04-15 LAB — I-STAT BETA HCG BLOOD, ED (NOT ORDERABLE): I-stat hCG, quantitative: >2000 m[IU]/mL — ABNORMAL HIGH (ref <5)

## 2022-04-15 LAB — URINALYSIS, ROUTINE W REFLEX MICROSCOPIC
Bilirubin Urine: NEGATIVE
Glucose, UA: NEGATIVE mg/dL
Hgb urine dipstick: NEGATIVE
Ketones, ur: NEGATIVE mg/dL
Nitrite: NEGATIVE
Protein, ur: NEGATIVE mg/dL
Specific Gravity, Urine: 1.014 (ref 1.005–1.030)
pH: 6 (ref 5.0–8.0)

## 2022-04-15 LAB — ABO/RH: ABO/RH(D): B POS

## 2022-04-15 LAB — COMPREHENSIVE METABOLIC PANEL
ALT: 11 U/L (ref 0–44)
AST: 17 U/L (ref 15–41)
Albumin: 3.8 g/dL (ref 3.5–5.0)
Alkaline Phosphatase: 8 U/L — ABNORMAL LOW (ref 38–126)
Anion gap: 10 (ref 5–15)
BUN: 6 mg/dL (ref 6–20)
CO2: 22 mmol/L (ref 22–32)
Calcium: 9.1 mg/dL (ref 8.9–10.3)
Chloride: 103 mmol/L (ref 98–111)
Creatinine, Ser: 0.73 mg/dL (ref 0.44–1.00)
GFR, Estimated: >60 mL/min (ref >60)
Glucose, Bld: 121 mg/dL — ABNORMAL HIGH (ref 70–99)
Potassium: 3.9 mmol/L (ref 3.5–5.1)
Sodium: 135 mmol/L (ref 135–145)
Total Bilirubin: 0.7 mg/dL (ref 0.3–1.2)
Total Protein: 6.5 g/dL (ref 6.5–8.1)

## 2022-04-15 LAB — CBC
HCT: 38.4 % (ref 36.0–46.0)
Hemoglobin: 13.5 g/dL (ref 12.0–15.0)
MCH: 32 pg (ref 26.0–34.0)
MCHC: 35.2 g/dL (ref 30.0–36.0)
MCV: 91 fL (ref 80.0–100.0)
Platelets: 212 10*3/uL (ref 150–400)
RBC: 4.22 MIL/uL (ref 3.87–5.11)
RDW: 12.6 % (ref 11.5–15.5)
WBC: 6.4 10*3/uL (ref 4.0–10.5)
nRBC: 0 % (ref 0.0–0.2)

## 2022-04-15 LAB — WET PREP, GENITAL
Clue Cells Wet Prep HPF POC: NONE SEEN
Sperm: NONE SEEN
Trich, Wet Prep: NONE SEEN
WBC, Wet Prep HPF POC: >=10 — AB (ref <10)

## 2022-04-15 LAB — HIV ANTIBODY (ROUTINE TESTING W REFLEX): HIV Screen 4th Generation wRfx: NONREACTIVE

## 2022-04-15 LAB — RPR: RPR Ser Ql: NONREACTIVE

## 2022-04-15 LAB — LIPASE, BLOOD: Lipase: 47 U/L (ref 11–51)

## 2022-04-15 MED ORDER — TERCONAZOLE 0.4 % VA CREA: 1.0000 | TOPICAL_CREAM | Freq: Every day | VAGINAL | 1 refills | Status: AC

## 2022-04-15 MED ORDER — FLEET ENEMA 7-19 GM/118ML RE ENEM: 1.0000 | ENEMA | Freq: Every day | RECTAL | 3 refills | Status: AC | PRN

## 2022-04-15 MED ORDER — SORBITOL 70 % SOLN
960.0000 mL | TOPICAL_OIL | Freq: Once | ORAL | Status: AC
  Administered 2022-04-15: 960 mL via RECTAL
  Filled 2022-04-15: qty 240

## 2022-04-15 MED ORDER — POLYETHYLENE GLYCOL 3350 17 GM/SCOOP PO POWD: 17.0000 g | Freq: Two times a day (BID) | ORAL | 3 refills | Status: AC | PRN

## 2022-04-15 MED ORDER — PROMETHAZINE HCL 25 MG PO TABS: 25.0000 mg | ORAL_TABLET | Freq: Four times a day (QID) | ORAL | 3 refills | Status: AC | PRN

## 2022-04-15 MED ORDER — ONDANSETRON 4 MG PO TBDP: 4.0000 mg | ORAL_TABLET | Freq: Four times a day (QID) | ORAL | 3 refills | Status: AC | PRN

## 2022-04-15 NOTE — MAU Provider Note (Signed)
At time of discharge pt requested enema. SMOD enema ordered.   Pt had good results from enema. Still feeling some rectal pressure, but ready to go home.   D/C home in stable condition.   [place], CNM 04/15/2022 1:42 PM

## 2022-04-15 NOTE — MAU Note (Signed)
Melissa Vang is a 37 y.o. at [redacted]w[redacted]d here in MAU reporting: constipation, states she had a BM yesterday but it was very difficult. Intermittent abdominal pain.   Onset of complaint: ongoing  Pain score: 3/10, sometimes intensity increases  Vitals:   04/15/22 0859 04/15/22 0933  BP: 109/65 119/72  Pulse: 71 69  Resp: 16 16  Temp: 98.2 F (36.8 C) 98.1 F (36.7 C)  SpO2: 100% 100%     FHT:NA  Lab orders placed from triage: UA previously ordered in ED

## 2022-04-15 NOTE — ED Triage Notes (Signed)
Pt here via POV with reports of lower abdominal pain L>R along with constipation. Pt states she is [redacted] weeks pregnant.

## 2022-04-15 NOTE — ED Provider Triage Note (Signed)
Emergency Medicine Provider OB Triage Evaluation Note  Melissa Vang is a 37 y.o. female, G1P0, at [redacted]w[redacted]d gestation who presents to the emergency department with complaints of constipation. Report lower abd pain with constipation x 1 week.  Nausea without vomiting.  No fever, no dysuria, vaginal bleeding or vaginal discharge.   Review of  Systems  Positive: as above Negative: as above  Physical Exam  BP 109/65 (BP Location: Right Arm)   Pulse 71   Temp 98.2 F (36.8 C) (Oral)   Resp 16   LMP 02/06/2022   SpO2 100%  General: Awake, no distress  HEENT: Atraumatic  Resp: Normal effort  Cardiac: Normal rate Abd: Nondistended, nontender  MSK: Moves all extremities without difficulty Neuro: Speech clear  Medical Decision Making  Pt evaluated for pregnancy concern and is stable for transfer to MAU. Pt is in agreement with plan for transfer.  9:19 AM Discussed with MAU APP, [place], who accepts patient in transfer.  Clinical Impression  No diagnosis found.     Domenic Moras, PA-C 04/15/22 [phone]

## 2022-04-15 NOTE — Discharge Instructions (Signed)
[place] for [employer] at [facility]             [address] [place], [place] [address] [place] for [facility] at [place], [place], [place], [place], [address] [phone]  Center for [facility] at [employer], [place], [place], [place], [address] [phone]  [facility] at [facility] [address], [place], [place], [place], [address] [phone]  Center for [facility] at [facility]                                 [place], [place], [place], [address] [phone]  Center for [facility] at [facility]                                    [address] [place], [place], [address] [place] for [place] at [facility] [address] [place], [place], [place], [address]                              [facility] [place], [place], [place], [place], [address] [phone]  [facility]         Phone: [phone]  [facility]      Phone: [phone]   [facility]      Phone: [place] [facility]         Phone: [phone]   [place] [facility]    Phone: [place]      Phone: [phone]  [facility]     Phone: [phone]  [facility]        Phone: [phone]  [facility]) [phone]  [facility]      Phone: [phone]  [facility] [address] [place], [place] [address] Phone: [phone]

## 2022-04-15 NOTE — MAU Provider Note (Signed)
Chief Complaint: Abdominal Pain and Constipation   Event Date/Time   First Provider Initiated Contact with Patient 04/15/22 0959     SUBJECTIVE HPI: Melissa Vang is a 37 y.o. G3P0020 at [redacted]w[redacted]d who presents to Maternity Admissions reporting moderate low abd/pelvic pressure and pain x a few days. Feels constipated. Last BM yesterday but had to use her finger to loosen stool. Still feeling pressure. Hasn't been able to eat well due to N/V. Has Phenergan and Zofran which help, but sometimes misses doses.    Vaginal Bleeding: Denies Passage of tissue or clots: Denies   B POS  Pain Location: Low abd/pelvis Quality: pressure Severity: 3/10 on pain scale. More painful at times.  Duration: few days Course: Some improvement since last night Context: Early pregnancy. Nml [place] per pt at [facility]. No records available for review.  Timing: constant Modifying factors: Some improvement with passage of stool. Associated signs and symptoms: Neg for vaginal bleeding, fever, chills, urinary complaints or diarrhea. Pos for nausea, vomiting, constipation, malodorous discharge.   Past Medical History:  Diagnosis Date   Anxiety    Depression    Currently following with [facility] 04/2017   Panic attack    04/2017  currently followed at [facility] History  Gravida Para Term Preterm AB Living  3       2 0  SAB IAB Ectopic Multiple Live Births    2          # Outcome Date GA Lbr Len/2nd Weight Sex Delivery Anes PTL Lv  3 Current           2 IAB      TAB     1 IAB      TAB      Past Surgical History:  Procedure Laterality Date   MOUTH SURGERY     MOUTH SURGERY  age 37   for impacted tooth in hard palate   Social History   Socioeconomic History   Marital status: Single    Spouse name: Not on file   Number of children: 0   Years of education: 64   Highest education level: 12th grade  Occupational History   Not on file  Tobacco Use   Smoking status: Former    Packs/day: 0.25     Years: 18.00    Total pack years: 4.50    Types: Cigarettes   Smokeless tobacco: Never  Vaping Use   Vaping Use: Every day   Start date: 09/24/2016   Last attempt to quit: 03/26/2017   Substances: Nicotine  Substance and Sexual Activity   Alcohol use: Yes    Comment: 1-2 drinks per week, social drinking   Drug use: No   Sexual activity: Yes  Other Topics Concern   Not on file  Social History Narrative   Lives [place] to [place] now [address] with cat, Rorschach.   House broken up into apartments.   Social Determinants of Health   Financial Resource Strain: Low Risk  (05/27/2017)   Overall Financial Resource Strain (CARDIA)    Difficulty of Paying Living Expenses: Not very hard  Food Insecurity: Food Insecurity Present (05/27/2017)   Hunger Vital Sign    Worried About Running Out of Food in the Last Year: Sometimes true    Ran Out of Food in the Last Year: Sometimes true  Transportation Needs: Not on file  Physical Activity: Not on file  Stress: Stress Concern Present (05/27/2017)   [employer] of  Occupational Health - Occupational Stress Questionnaire    Feeling of Stress : Rather much  Social Connections: Unknown (05/27/2017)   Social Connection and Isolation Panel [NHANES]    Frequency of Communication with Friends and Family: More than three times a week    Frequency of Social Gatherings with Friends and Family: More than three times a week    Attends Religious Services: Not on Advertising copywriter or Organizations: Not on file    Attends Archivist Meetings: Not on file    Marital Status: Not on file  Intimate Partner Violence: Not on file   No current facility-administered medications on file prior to encounter.   No current outpatient medications on file prior to encounter.   Allergies  Allergen Reactions   Amoxicillin Anaphylaxis, Swelling and Other (See Comments)    Has patient had a PCN reaction causing immediate rash,  facial/tongue/throat swelling, SOB or lightheadedness with hypotension: Yes Has patient had a PCN reaction causing severe rash involving mucus membranes or skin necrosis: Yes Has patient had a PCN reaction that required hospitalization: Unknown Has patient had a PCN reaction occurring within the last 10 years: No If all of the above answers are "NO", then may proceed with Cephalosporin use.  Other reaction(s):  ([id number])   Penicillins Anaphylaxis, Swelling and Other (See Comments)    Childhood allergy/Throat swelling in particular Has patient had a PCN reaction causing immediate rash, facial/tongue/throat swelling, SOB or lightheadedness with hypotension: Yes Has patient had a PCN reaction causing severe rash involving mucus membranes or skin necrosis: Yes Has patient had a PCN reaction that required hospitalization: Yes Has patient had a PCN reaction occurring within the last 10 years: No If all of the above answers are "NO", then may proceed with Cephalosporin use.    Morphine And Related Itching, Rash and Other (See Comments)    "Felt like my skin was burning- instant fire where injected"   Molds & Smuts Other (See Comments)    "Fuzzy feeling in my throat"    I have reviewed the past Medical Hx, Surgical Hx, Social Hx, Allergies and Medications.   Review of Systems  Constitutional:  Negative for chills and fever.  Gastrointestinal:  Positive for abdominal pain, constipation, nausea, rectal pain (pressure) and vomiting. Negative for diarrhea.  Genitourinary:  Positive for pelvic pain and vaginal discharge. Negative for difficulty urinating, dysuria, flank pain, frequency, hematuria, urgency and vaginal bleeding.    OBJECTIVE Patient Vitals for the past 24 hrs:  BP Temp Temp src Pulse Resp SpO2  04/15/22 0952 109/71 98.8 F (37.1 C) Oral 73 17 100 %  04/15/22 0933 119/72 98.1 F (36.7 C) Oral 69 16 100 %  04/15/22 0859 109/65 98.2 F (36.8 C) Oral 71 16 100 %    Constitutional: Well-developed, well-nourished female in no acute distress.  Cardiovascular: normal rate Respiratory: normal rate and effort.  GI: Abd soft, mild low abd TTP, R>L . Fundus 1/SP.  MS: Extremities nontender, no edema, normal ROM Neurologic: Alert and oriented x 4.  GU:  BIMANUAL EXAM: NEFG, Moderate whote discharge, no blood noted. cervix closed; uterus 9-10 week size, no adnexal tenderness or masses. No CMT.  LAB RESULTS Results for orders placed or performed during the hospital encounter of 04/15/22 (from the past 24 hour(s))  Lipase, blood     Status: None   Collection Time: 04/15/22  9:16 AM  Result Value Ref Range   Lipase 47 11 - 51  U/L  Comprehensive metabolic panel     Status: Abnormal   Collection Time: 04/15/22  9:16 AM  Result Value Ref Range   Sodium 135 135 - 145 mmol/L   Potassium 3.9 3.5 - 5.1 mmol/L   Chloride 103 98 - 111 mmol/L   CO2 22 22 - 32 mmol/L   Glucose, Bld 121 (H) 70 - 99 mg/dL   BUN 6 6 - 20 mg/dL   Creatinine, Ser 0.73 0.44 - 1.00 mg/dL   Calcium 9.1 8.9 - 10.3 mg/dL   Total Protein 6.5 6.5 - 8.1 g/dL   Albumin 3.8 3.5 - 5.0 g/dL   AST 17 15 - 41 U/L   ALT 11 0 - 44 U/L   Alkaline Phosphatase 8 (L) 38 - 126 U/L   Total Bilirubin 0.7 0.3 - 1.2 mg/dL   GFR, Estimated >60 >60 mL/min   Anion gap 10 5 - 15  CBC     Status: None   Collection Time: 04/15/22  9:16 AM  Result Value Ref Range   WBC 6.4 4.0 - 10.5 K/uL   RBC 4.22 3.87 - 5.11 MIL/uL   Hemoglobin 13.5 12.0 - 15.0 g/dL   HCT 38.4 36.0 - 46.0 %   MCV 91.0 80.0 - 100.0 fL   MCH 32.0 26.0 - 34.0 pg   MCHC 35.2 30.0 - 36.0 g/dL   RDW 12.6 11.5 - 15.5 %   Platelets 212 150 - 400 K/uL   nRBC 0.0 0.0 - 0.2 %  I-Stat beta hCG blood, ED     Status: Abnormal   Collection Time: 04/15/22  9:50 AM  Result Value Ref Range   I-stat hCG, quantitative >2,000.0 (H) <5 mIU/mL   Comment 3          ABO/Rh     Status: None   Collection Time: 04/15/22  9:58 AM  Result Value Ref Range    ABO/RH(D) B POS    No rh immune globuloin      NOT A RH IMMUNE GLOBULIN CANDIDATE, PT RH POSITIVE Performed at [facility] [address], [place], [place] [address]   Urinalysis, Routine w reflex microscopic     Status: Abnormal   Collection Time: 04/15/22 10:04 AM  Result Value Ref Range   Color, Urine YELLOW YELLOW   APPearance CLEAR CLEAR   Specific Gravity, Urine 1.014 1.005 - 1.030   pH 6.0 5.0 - 8.0   Glucose, UA NEGATIVE NEGATIVE mg/dL   Hgb urine dipstick NEGATIVE NEGATIVE   Bilirubin Urine NEGATIVE NEGATIVE   Ketones, ur NEGATIVE NEGATIVE mg/dL   Protein, ur NEGATIVE NEGATIVE mg/dL   Nitrite NEGATIVE NEGATIVE   Leukocytes,Ua LARGE (A) NEGATIVE   RBC / HPF 0-5 0 - 5 RBC/hpf   WBC, UA 0-5 0 - 5 WBC/hpf   Bacteria, UA RARE (A) NONE SEEN   Squamous Epithelial / LPF 0-5 0 - 5 /HPF   Mucus PRESENT   Wet prep, genital     Status: Abnormal   Collection Time: 04/15/22 10:05 AM  Result Value Ref Range   Yeast Wet Prep HPF POC PRESENT (A) NONE SEEN   Trich, Wet Prep NONE SEEN NONE SEEN   Clue Cells Wet Prep HPF POC NONE SEEN NONE SEEN   WBC, Wet Prep HPF POC >=10 (A) <10   Sperm NONE SEEN     IMAGING US OB Comp Less 14 Wks  Result Date: 04/15/2022 CLINICAL DATA:  Abdominal pain EXAM: OBSTETRIC <14 WK ULTRASOUND  TECHNIQUE: Transabdominal ultrasound was performed for evaluation of the gestation as well as the maternal uterus and adnexal regions. COMPARISON:  None Available. FINDINGS: Intrauterine gestational sac: Single Yolk sac:  Visualized. Embryo:  Visualized. Cardiac Activity: Visualized. Heart Rate: 167 bpm CRL:   15.8 mm   8 w 0 d                  [place] EDC: 11/25/2022 Subchorionic hemorrhage: Small, fundal subchorionic hemorrhage measuring 1.3 x 1.1 x 0.6 cm. Maternal uterus/adnexae: Left ovarian corpus luteum. Otherwise normal. IMPRESSION: 1. Single intrauterine gestation at sonographic gestational age of [redacted] weeks, 0 days. EDD 11/25/2022. Fetal heart rate 167 beats per  minute. 2.  Small fundal subchorionic hemorrhage. Electronically Signed   By: Jearld Lesch M.D.   On: 04/15/2022 10:49    MAU COURSE Orders Placed This Encounter  Procedures   Wet prep, genital   US OB Comp Less 14 Wks   Lipase, blood   Comprehensive metabolic panel   CBC   Urinalysis, Routine w reflex microscopic   RPR   HIV Antibody (routine testing w rflx)   Diet NPO time specified   I-Stat beta hCG blood, ED   I-Stat beta hCG blood, ED   ABO/Rh     MDM Pain in early pregnancy with normal intrauterine pregnancy and hemodynamically stable. Likely due to combination of constipation, subchorionic hemorrhage and/or corpus luteum cysts. No evidence of ectopic pregnancy or other emergent condition. Low suspicion for appendicitis due to location of pain and absence of fever, leukocytosis.    S>D. Will change EDD to 11/25/22 based on today's [place]. 8 week live IUP.   ASSESSMENT 1. Abdominal pain during pregnancy in first trimester   2. Constipation during pregnancy in first trimester   3. Corpus luteum cyst of left ovary   4. Subchorionic hemorrhage of placenta in first trimester, single or unspecified fetus   5. Vaginal yeast infection   6. [redacted] weeks gestation of pregnancy     PLAN Discharge home in stable condition. First trimester precautions List of providers given.  Miralax BID. Fleet's enema PRN. Increase fiber and fluids. Limit Zofran.  Allergies as of 04/15/2022       Reactions   Amoxicillin Anaphylaxis, Swelling, Other (See Comments)   Has patient had a PCN reaction causing immediate rash, facial/tongue/throat swelling, SOB or lightheadedness with hypotension: Yes Has patient had a PCN reaction causing severe rash involving mucus membranes or skin necrosis: Yes Has patient had a PCN reaction that required hospitalization: Unknown Has patient had a PCN reaction occurring within the last 10 years: No If all of the above answers are "NO", then may proceed with Cephalosporin  use. Other reaction(s):  ([id number])   Penicillins Anaphylaxis, Swelling, Other (See Comments)   Childhood allergy/Throat swelling in particular Has patient had a PCN reaction causing immediate rash, facial/tongue/throat swelling, SOB or lightheadedness with hypotension: Yes Has patient had a PCN reaction causing severe rash involving mucus membranes or skin necrosis: Yes Has patient had a PCN reaction that required hospitalization: Yes Has patient had a PCN reaction occurring within the last 10 years: No If all of the above answers are "NO", then may proceed with Cephalosporin use.   Morphine And Related Itching, Rash, Other (See Comments)   "Felt like my skin was burning- instant fire where injected"   Molds & Smuts Other (See Comments)   "Fuzzy feeling in my throat"        Medication List  TAKE these medications    ondansetron 4 MG disintegrating tablet Commonly known as: ZOFRAN-ODT Take 1 tablet (4 mg total) by mouth every 6 (six) hours as needed for nausea.   promethazine 25 MG tablet Commonly known as: PHENERGAN Take 1 tablet (25 mg total) by mouth every 6 (six) hours as needed for nausea or vomiting.   terconazole 0.4 % vaginal cream Commonly known as: TERAZOL 7 Place 1 applicator vaginally at bedtime.         Katrinka Blazing, [place], CNM 04/15/2022  11:24 AM  4

## 2022-04-16 LAB — GC/CHLAMYDIA PROBE AMP (~~LOC~~) NOT AT ARMC
Chlamydia: NEGATIVE
Comment: NEGATIVE
Comment: NORMAL
Neisseria Gonorrhea: NEGATIVE

## 2022-05-23 ENCOUNTER — Encounter (HOSPITAL_COMMUNITY): Payer: Self-pay

## 2022-05-23 ENCOUNTER — Emergency Department (HOSPITAL_COMMUNITY)
Admission: EM | Admit: 2022-05-23 | Discharge: 2022-05-23 | Disposition: A | Discharge status: Home / Self Care | Payer: 59 | Attending: Emergency Medicine | Admitting: Emergency Medicine

## 2022-05-23 ENCOUNTER — Other Ambulatory Visit: Payer: Self-pay

## 2022-05-23 DIAGNOSIS — Z1152 Encounter for screening for COVID-19: Secondary | ICD-10-CM | POA: Insufficient documentation

## 2022-05-23 DIAGNOSIS — J069 Acute upper respiratory infection, unspecified: Secondary | ICD-10-CM | POA: Insufficient documentation

## 2022-05-23 DIAGNOSIS — R059 Cough, unspecified: Secondary | ICD-10-CM | POA: Diagnosis present

## 2022-05-23 LAB — RESP PANEL BY RT-PCR (RSV, FLU A&B, COVID)  RVPGX2
Influenza A by PCR: NEGATIVE
Influenza B by PCR: NEGATIVE
Resp Syncytial Virus by PCR: NEGATIVE
SARS Coronavirus 2 by RT PCR: NEGATIVE

## 2022-05-23 LAB — GROUP A STREP BY PCR: Group A Strep by PCR: NOT DETECTED

## 2022-05-23 MED ORDER — KETOROLAC TROMETHAMINE 15 MG/ML IJ SOLN
15.0000 mg | Freq: Once | INTRAMUSCULAR | Status: AC
  Administered 2022-05-23: 15 mg via INTRAMUSCULAR
  Filled 2022-05-23: qty 1

## 2022-05-23 MED ORDER — ACETAMINOPHEN 500 MG PO TABS
1000.0000 mg | ORAL_TABLET | Freq: Once | ORAL | Status: AC
  Administered 2022-05-23: 1000 mg via ORAL
  Filled 2022-05-23: qty 2

## 2022-05-23 NOTE — ED Triage Notes (Signed)
Pt c/o cough, congestion, body aches, headaches, and chills since yesterday.

## 2022-05-23 NOTE — Discharge Instructions (Signed)
You were seen today for upper respiratory infection. We did not identify any emergent cause for your symptoms. Your evaluation is most consistent with nonspecific viral cause.   Plan and next steps:   The following may be helpful in managing your symptoms:   Pain- Lidocaine Patches  Apply to affected area for up to 12 hours at a time.   Pain/Fever- Adult Tylenol dosing:  650 mg orally every 4 to 6 hours as needed, MAX: 3250 mg/24 hours   (Extra-strength) 1000 mg orally every 6 hours as needed; MAX: 3000 mg/24 hours   Do not use if you have liver disease. Read the label on the bottle.   Pain/Fever- Adult Ibuprofen Dosing  200 to 400 mg orally every 4 to 6 hours as needed; MAX 1200 mg/day; do not take longer than 10 days   Do not use if you have kidney disease. Read the label on the bottle   Findings:  You may see all of your lab and imaging results utilizing our online portal! Look in this document or ask a team member for your mychart* access information. The most notable results have additionally been verbally communicated with you and your bedside family.    Follow-up Plan:   Follow up with the patient's normal primary care provider for monitoring of this condition within 48 hours.   Signs/Symptoms that would warrant return to the ED:  Please return to the ED if you experience worsening of symptoms or any abrupt changes in your health. Standard of care precautions for your chief complaint have already been verbally communicated with you. Always be on alert for fevers, chills, shortness of breath, chest pains, or sudden changes that warrant immediate evaluation.    Thank you for allowing [place] to be a part of you and your families' care.   Tretha Sciara MD

## 2022-05-23 NOTE — ED Provider Notes (Signed)
[facility] Provider Note   CSN: [id number] Arrival date & time: 05/23/22  1722     History Chief Complaint  Patient presents with   Cough   Chills   Generalized Body Aches    HPI Melissa Vang is a 37 y.o. female presenting for fever cough and congestion x 2 days.  Works with the elderly transporting between hospitals.  Otherwise ambulatory tolerating p.o. intake. Denies nausea vomiting syncope shortness of breath or chest pain.  Patient's recorded medical, surgical, social, medication list and allergies were reviewed in the Snapshot window as part of the initial history.   Review of Systems   Review of Systems  Constitutional:  Negative for chills and fever.  HENT:  Positive for sore throat. Negative for ear pain.   Eyes:  Negative for pain and visual disturbance.  Respiratory:  Negative for cough and shortness of breath.   Cardiovascular:  Negative for chest pain and palpitations.  Gastrointestinal:  Negative for abdominal pain and vomiting.  Genitourinary:  Negative for dysuria and hematuria.  Musculoskeletal:  Negative for arthralgias and back pain.  Skin:  Negative for color change and rash.  Neurological:  Negative for seizures and syncope.  All other systems reviewed and are negative.   Physical Exam Updated Vital Signs BP 119/74 (BP Location: Right Arm)   Pulse 74   Temp 97.9 F (36.6 C) (Oral)   Resp 18   LMP 02/06/2022   SpO2 100%  Physical Exam Vitals and nursing note reviewed.  Constitutional:      General: She is not in acute distress.    Appearance: She is well-developed.  HENT:     Head: Normocephalic and atraumatic.     Mouth/Throat:     Pharynx: Posterior oropharyngeal erythema present.  Eyes:     Conjunctiva/sclera: Conjunctivae normal.  Cardiovascular:     Rate and Rhythm: Normal rate and regular rhythm.     Heart sounds: No murmur heard. Pulmonary:     Effort: Pulmonary effort is normal. No  respiratory distress.     Breath sounds: Normal breath sounds.  Abdominal:     General: There is no distension.     Palpations: Abdomen is soft.     Tenderness: There is no abdominal tenderness. There is no right CVA tenderness or left CVA tenderness.  Musculoskeletal:        General: No swelling or tenderness. Normal range of motion.     Cervical back: Neck supple.  Skin:    General: Skin is warm and dry.  Neurological:     General: No focal deficit present.     Mental Status: She is alert and oriented to person, place, and time. Mental status is at baseline.     Cranial Nerves: No cranial nerve deficit.      ED Course/ Medical Decision Making/ A&P    Procedures Procedures   Medications Ordered in ED Medications  ketorolac (TORADOL) 15 MG/ML injection 15 mg (15 mg Intramuscular Given 05/23/22 1816)  acetaminophen (TYLENOL) tablet 1,000 mg (1,000 mg Oral Given 05/23/22 1814)   Medical Decision Making:   Melissa Vang is a 37 y.o. female who presented to the ED today with subjective fever, cough, congestion detailed above.    Patient's presentation is complicated by their history of multiple comorbid medical problems.  Patient placed on continuous vitals and telemetry monitoring while in ED which was reviewed periodically.   Complete initial physical exam performed, notably the patient  was hemodynamically stable in no acute distress.  Posterior oropharynx illuminated and without obvious swelling or deformity.  Patient is without neck stiffness.    Reviewed and confirmed nursing documentation for past medical history, family history, social history.    Initial Assessment:   With the patient's presentation of fever cough congestion, most likely diagnosis is developing viral upper respiratory infection. Other diagnoses were considered including (but not limited to) peritonsillar abscess, retropharyngeal abscess, pneumonia. These are considered less likely due to history of present  illness and physical exam findings.   This is most consistent with an acute life/limb threatening illness complicated by underlying chronic conditions. Considered meningitis, however patient's symptoms, vital signs, physical exam findings including lack of meningismus seem grossly less consistent at this time. Initial Plan:  Viral screening including COVID/flu testing to evaluate for common viral etiologies that need to be tracked Empiric treatment with antipyretics including acetaminophen in ambulatory setting Will augment with dose of ketorolac in ED  Current [facility] warrants testing which was negative Objective evaluation as below reviewed   Initial Study Results:   Laboratory  All laboratory results reviewed without evidence of clinically relevant pathology.    Final Assessment and Plan:   On reassessment, patient is ambulatory tolerating p.o. intake in no acute distress.   Patient's COVID test is negative.  Patient is currently stable for outpatient care and management with no indication for hospitalization or transfer at this time.  Discussed all findings with patient expressed understanding.  Disposition:  Based on the above findings, I believe patient is stable for discharge.    Patient/family educated about specific return precautions for given chief complaint and symptoms.  Patient/family educated about follow-up with PCP.     Patient/family expressed understanding of return precautions and need for follow-up. Patient spoken to regarding all imaging and laboratory results and appropriate follow up for these results. All education provided in verbal form with additional information in written form. Time was allowed for answering of patient questions. Patient discharged.    Emergency Department Medication Summary:   Medications  ketorolac (TORADOL) 15 MG/ML injection 15 mg (15 mg Intramuscular Given 05/23/22 1816)  acetaminophen (TYLENOL) tablet 1,000 mg (1,000 mg Oral Given  05/23/22 1814)    Clinical Impression:  1. Upper respiratory tract infection, unspecified type      Discharge   Final Clinical Impression(s) / ED Diagnoses Final diagnoses:  Upper respiratory tract infection, unspecified type    Rx / DC Orders ED Discharge Orders     None         Tretha Sciara, MD 05/23/22 2252

## 2022-06-04 ENCOUNTER — Emergency Department (HOSPITAL_BASED_OUTPATIENT_CLINIC_OR_DEPARTMENT_OTHER)
Admission: RE | Admit: 2022-06-04 | Discharge: 2022-06-04 | Disposition: A | Discharge status: Home / Self Care | Payer: 59 | Source: Ambulatory Visit | Attending: Student | Admitting: Student

## 2022-06-04 ENCOUNTER — Other Ambulatory Visit: Payer: Self-pay

## 2022-06-04 ENCOUNTER — Emergency Department (HOSPITAL_COMMUNITY)
Admission: EM | Admit: 2022-06-04 | Discharge: 2022-06-04 | Disposition: A | Discharge status: Home / Self Care | Payer: 59 | Attending: Emergency Medicine | Admitting: Emergency Medicine

## 2022-06-04 ENCOUNTER — Encounter (HOSPITAL_COMMUNITY): Payer: Self-pay | Admitting: Emergency Medicine

## 2022-06-04 DIAGNOSIS — M79661 Pain in right lower leg: Secondary | ICD-10-CM

## 2022-06-04 DIAGNOSIS — R52 Pain, unspecified: Secondary | ICD-10-CM | POA: Diagnosis not present

## 2022-06-04 DIAGNOSIS — R791 Abnormal coagulation profile: Secondary | ICD-10-CM | POA: Insufficient documentation

## 2022-06-04 LAB — COMPREHENSIVE METABOLIC PANEL
ALT: 10 U/L (ref 0–44)
AST: 15 U/L (ref 15–41)
Albumin: 4.1 g/dL (ref 3.5–5.0)
Alkaline Phosphatase: 8 U/L — ABNORMAL LOW (ref 38–126)
Anion gap: 11 (ref 5–15)
BUN: 15 mg/dL (ref 6–20)
CO2: 25 mmol/L (ref 22–32)
Calcium: 9.4 mg/dL (ref 8.9–10.3)
Chloride: 103 mmol/L (ref 98–111)
Creatinine, Ser: 0.95 mg/dL (ref 0.44–1.00)
GFR, Estimated: >60 mL/min (ref >60)
Glucose, Bld: 109 mg/dL — ABNORMAL HIGH (ref 70–99)
Potassium: 4.4 mmol/L (ref 3.5–5.1)
Sodium: 139 mmol/L (ref 135–145)
Total Bilirubin: 0.5 mg/dL (ref 0.3–1.2)
Total Protein: 7.2 g/dL (ref 6.5–8.1)

## 2022-06-04 LAB — CBC WITH DIFFERENTIAL/PLATELET
Abs Immature Granulocytes: 0.03 10*3/uL (ref 0.00–0.07)
Basophils Absolute: 0.1 10*3/uL (ref 0.0–0.1)
Basophils Relative: 1 %
Eosinophils Absolute: 0.2 10*3/uL (ref 0.0–0.5)
Eosinophils Relative: 2 %
HCT: 39.1 % (ref 36.0–46.0)
Hemoglobin: 12.9 g/dL (ref 12.0–15.0)
Immature Granulocytes: 0 %
Lymphocytes Relative: 31 %
Lymphs Abs: 3 10*3/uL (ref 0.7–4.0)
MCH: 31.2 pg (ref 26.0–34.0)
MCHC: 33 g/dL (ref 30.0–36.0)
MCV: 94.7 fL (ref 80.0–100.0)
Monocytes Absolute: 0.6 10*3/uL (ref 0.1–1.0)
Monocytes Relative: 7 %
Neutro Abs: 5.8 10*3/uL (ref 1.7–7.7)
Neutrophils Relative %: 59 %
Platelets: 254 10*3/uL (ref 150–400)
RBC: 4.13 MIL/uL (ref 3.87–5.11)
RDW: 13.2 % (ref 11.5–15.5)
WBC: 9.7 10*3/uL (ref 4.0–10.5)
nRBC: 0 % (ref 0.0–0.2)

## 2022-06-04 LAB — D-DIMER, QUANTITATIVE: D-Dimer, Quant: 2.62 ug/mL-FEU — ABNORMAL HIGH (ref 0.00–0.50)

## 2022-06-04 MED ORDER — ENOXAPARIN SODIUM 40 MG/0.4ML IJ SOSY
40.0000 mg | PREFILLED_SYRINGE | Freq: Once | INTRAMUSCULAR | Status: AC
  Administered 2022-06-04: 40 mg via SUBCUTANEOUS
  Filled 2022-06-04: qty 0.4

## 2022-06-04 NOTE — ED Provider Notes (Signed)
[place] EMERGENCY DEPARTMENT AT [facility] Provider Note   CSN: [id number] Arrival date & time: 06/04/22  0110     History  Chief Complaint  Patient presents with   Leg Pain    R    Melissa Vang is a 37 y.o. female. With past medical history of anxiety and depression who presents to the emergency department with right calf pain.  Patient states symptoms have been present for about 3 days. She states that she initially had a sharp pain to her right shin which then wrapped around to her right calf. She states that the pain has been worsening over the past 3 days. States it now feels like a constant cramping or throbbing pain. It is worse with movement. Nothing improves it. She denies recent extraneous physical activity or weight lifting, trauma to the leg or leg swelling. She denies numbness or tingling to the leg. Denies chest pain ,shortness of breath or palpitations. Denies recent long flights or drives, malignancy, surgery or immobilization. She does vape and is on a estrogen/progesterone birth control patch. No history of DVT or PE.    Leg Pain      Home Medications Prior to Admission medications   Medication Sig Start Date End Date Taking? Authorizing Provider  acetaminophen (TYLENOL) 500 MG tablet Take 500 mg by mouth every 6 (six) hours as needed for moderate pain.   Yes [provider]  hydrOXYzine (ATARAX) 25 MG tablet Take 25 mg by mouth 3 (three) times daily as needed for anxiety.   Yes [provider]  ibuprofen (ADVIL) 200 MG tablet Take 400 mg by mouth every 6 (six) hours as needed for moderate pain.   Yes [provider]  ondansetron (ZOFRAN-ODT) 4 MG disintegrating tablet Take 1 tablet (4 mg total) by mouth every 6 (six) hours as needed for nausea. 04/15/22  Yes [place], [place], CNM  polyethylene glycol powder (GLYCOLAX/MIRALAX) 17 GM/SCOOP powder Take 17 g by mouth 2 (two) times daily as needed for moderate constipation.  04/15/22  Yes Smith, [place], CNM  promethazine (PHENERGAN) 25 MG tablet Take 1 tablet (25 mg total) by mouth every 6 (six) hours as needed for nausea or vomiting. 04/15/22  Yes [place], [place], CNM  sodium phosphate (FLEET) 7-19 GM/118ML ENEM Place 133 mLs (1 enema total) rectally daily as needed for severe constipation. 04/15/22  Yes [place], [place], [place]  Melissa Vang 150-35 MCG/24HR transdermal patch Place 1 patch onto the skin once a week. 05/17/22  Yes [provider]  terconazole (TERAZOL 7) 0.4 % vaginal cream Place 1 applicator vaginally at bedtime. Patient not taking: Reported on 06/04/2022 04/15/22   Melissa Vang [place], CNM      Allergies    Amoxicillin, Penicillins, Morphine and related, and Molds & smuts    Review of Systems   Review of Systems  Musculoskeletal:  Positive for myalgias.  All other systems reviewed and are negative.   Physical Exam Updated Vital Signs BP (!) 143/93   Pulse 64   Temp 98.5 F (36.9 C) (Oral)   Resp 18   Ht 5' 4"$  (1.626 m)   Wt 62.6 kg   LMP 02/06/2022   SpO2 100%   Breastfeeding Unknown   BMI 23.69 kg/m  Physical Exam Vitals and nursing note reviewed.  Constitutional:      General: She is not in acute distress.    Appearance: Normal appearance. She is not ill-appearing.  HENT:     Head: Normocephalic.  Eyes:  General: No scleral icterus. Cardiovascular:     Rate and Rhythm: Normal rate and regular rhythm.     Pulses: Normal pulses.     Heart sounds: No murmur heard. Pulmonary:     Effort: Pulmonary effort is normal. No respiratory distress.     Breath sounds: Normal breath sounds.  Abdominal:     General: Bowel sounds are normal.     Palpations: Abdomen is soft.  Musculoskeletal:        General: Tenderness present. No swelling, deformity or signs of injury. Normal range of motion.     Right lower leg: No edema.     Left lower leg: No edema.  Skin:    General: Skin is warm and dry.     Capillary Refill: Capillary refill takes  less than 2 seconds.     Findings: No erythema.  Neurological:     General: No focal deficit present.     Mental Status: She is alert and oriented to person, place, and time. Mental status is at baseline.  Psychiatric:        Mood and Affect: Mood normal.        Behavior: Behavior normal.        Thought Content: Thought content normal.        Judgment: Judgment normal.     ED Results / Procedures / Treatments   Labs (all labs ordered are listed, but only abnormal results are displayed) Labs Reviewed  COMPREHENSIVE METABOLIC PANEL - Abnormal; Notable for the following components:      Result Value   Glucose, Bld 109 (*)    Alkaline Phosphatase 8 (*)    All other components within normal limits  D-DIMER, QUANTITATIVE - Abnormal; Notable for the following components:   D-Dimer, Quant 2.62 (*)    All other components within normal limits  CBC WITH DIFFERENTIAL/PLATELET    EKG None  Radiology No results found.  Procedures Procedures   Medications Ordered in ED Medications  enoxaparin (LOVENOX) injection 40 mg (has no administration in time range)    ED Course/ Medical Decision Making/ A&P  Medical Decision Making Amount and/or Complexity of Data Reviewed Labs: ordered.  Initial Impression and Ddx 37 year old female who presents to the emergency department with calf pain for 3 days. Patient PMH that increases complexity of ED encounter:  anxiety and depression Differential: MSK pain like strain, cramp, rhabdomyolysis, DVT, etc.  Interpretation of Diagnostics I independent reviewed and interpreted the labs as followed: d-dimer elevated  - I independently visualized the following imaging with scope of interpretation limited to determining acute life threatening conditions related to emergency care: not indicated   Patient Reassessment and Ultimate Disposition/Management 37 year old female who presents for right calf pain for 3 days. Physical exam is unremarkable.   She has some mild soreness to palpation of the right calf.  There is no obvious swelling of the right leg compared to the left.  There is no erythema, palpable cord.  Compartments are soft.  DP pulse 1+.  The leg is warm and dry.  Cap refill less than 2 seconds. This is nontraumatic pain and do not feel that x-ray is warranted at this time. Unfortunately, we do not have ultrasound at this time of night.  Proceeded with basic lab work including a D-dimer to further screen this patient for DVT.  She is on estrogen products and smokes. D-dimer is elevated. She was given a dose of Lovenox here in the emergency department. I have sent a  standing order for DVT study for later today.  She will need further workup at DVT clinic including lower extremity ultrasound and further workup if needed.  She has no shortness of breath, chest pain or palpitations concerning for PE at this time.  No hypoxia, tachycardia. At this time most concerning would be possible DVT of the right lower extremity.  Do not feel that her symptoms are consistent with a compartment syndrome and she has no real risk factors for rhabdo.  There is no evidence of an ischemic limb on exam.  Do not feel this is arterial or claudication. I discussed the D-dimer results with the patient and need for ultrasound later this morning.  She is agreeable to this plan.  I have given her follow-up instructions.  Given strict return precautions should she develop chest pain or shortness of breath or difficulty breathing.  The patient has been appropriately medically screened and/or stabilized in the ED. I have low suspicion for any other emergent medical condition which would require further screening, evaluation or treatment in the ED or require inpatient management. At time of discharge the patient is hemodynamically stable and in no acute distress. I have discussed work-up results and diagnosis with patient and answered all questions. Patient is agreeable  with discharge plan. We discussed strict return precautions for returning to the emergency department and they verbalized understanding.    Patient management required discussion with the following services or consulting groups:  None  Complexity of Problems Addressed Acute complicated illness or Injury  Additional Data Reviewed and Analyzed Further history obtained from: Past medical history and medications listed in the EMR, Prior ED visit notes, and Care Everywhere  Patient Encounter Risk Assessment Minor Procedures  Final Clinical Impression(s) / ED Diagnoses Final diagnoses:  Right calf pain    Rx / DC Orders ED Discharge Orders          Ordered    LE Venous       Comments: IMPORTANT PATIENT INSTRUCTIONS:  You have been scheduled for an Outpatient Vascular Study at [facility].    If tomorrow is a Saturday, Sunday or holiday, please go to the [facility] Emergency Department Registration Desk at 11 am tomorrow morning and tell them you are there for a vascular study.   If tomorrow is a weekday (Monday-Friday), please go to [place] [place] at 11 am and tell them you are there for a vascular study.   06/04/22 0345              Mickie Hillier, PA-C 06/04/22 0513    Molpus, Jenny Reichmann, MD 06/04/22 [phone]

## 2022-06-04 NOTE — ED Notes (Signed)
Discharge instruction including follow up out patient vascular study discussed with pt. Pt verbalized with no additional questions at this time.

## 2022-06-04 NOTE — ED Triage Notes (Addendum)
Pt c/o right lower leg pain onset 3 days ago. States pain started in her shin and has now moved into her calf. Pain worsening with movement, described as throbbing. Currently on birth control patch. No CP/SOB. No injury/trauma. No increased swelling. Pt ambulatory in triage.

## 2022-06-04 NOTE — Discharge Instructions (Addendum)
IMPORTANT PATIENT INSTRUCTIONS:  You have been scheduled for an Outpatient Vascular Study at [facility].    If tomorrow is a Saturday, Sunday or holiday, please go to the [facility] Emergency Department Registration Desk at 11 am tomorrow morning and tell them you are there for a vascular study.   If tomorrow is a weekday (Monday-Friday), please go to [place] [place] at 11 am and tell them you are there for a vascular study.  Please return to the emergency department if you develop chest pain, shortness of breath or difficulty breathing.

## 2022-06-04 NOTE — Progress Notes (Signed)
Lower extremity venous duplex has been completed.   Preliminary results in CV Proc.   Jinny Blossom [place]ight 06/04/2022 11:40 AM

## 2022-08-29 ENCOUNTER — Other Ambulatory Visit: Payer: Self-pay

## 2022-08-29 ENCOUNTER — Emergency Department (HOSPITAL_COMMUNITY)
Admission: EM | Admit: 2022-08-29 | Discharge: 2022-08-29 | Disposition: A | Discharge status: Home / Self Care | Payer: Medicaid Other | Attending: Emergency Medicine | Admitting: Emergency Medicine

## 2022-08-29 ENCOUNTER — Emergency Department (HOSPITAL_COMMUNITY): Payer: Medicaid Other

## 2022-08-29 DIAGNOSIS — R110 Nausea: Secondary | ICD-10-CM | POA: Diagnosis not present

## 2022-08-29 DIAGNOSIS — W57XXXA Bitten or stung by nonvenomous insect and other nonvenomous arthropods, initial encounter: Secondary | ICD-10-CM | POA: Diagnosis not present

## 2022-08-29 DIAGNOSIS — R0789 Other chest pain: Secondary | ICD-10-CM | POA: Insufficient documentation

## 2022-08-29 DIAGNOSIS — M791 Myalgia, unspecified site: Secondary | ICD-10-CM | POA: Diagnosis not present

## 2022-08-29 DIAGNOSIS — R21 Rash and other nonspecific skin eruption: Secondary | ICD-10-CM | POA: Diagnosis not present

## 2022-08-29 LAB — CBC WITH DIFFERENTIAL/PLATELET
Abs Immature Granulocytes: 0.01 10*3/uL (ref 0.00–0.07)
Basophils Absolute: 0 10*3/uL (ref 0.0–0.1)
Basophils Relative: 1 %
Eosinophils Absolute: 0.2 10*3/uL (ref 0.0–0.5)
Eosinophils Relative: 2 %
HCT: 40.8 % (ref 36.0–46.0)
Hemoglobin: 14 g/dL (ref 12.0–15.0)
Immature Granulocytes: 0 %
Lymphocytes Relative: 37 %
Lymphs Abs: 2.4 10*3/uL (ref 0.7–4.0)
MCH: 31.4 pg (ref 26.0–34.0)
MCHC: 34.3 g/dL (ref 30.0–36.0)
MCV: 91.5 fL (ref 80.0–100.0)
Monocytes Absolute: 0.5 10*3/uL (ref 0.1–1.0)
Monocytes Relative: 8 %
Neutro Abs: 3.4 10*3/uL (ref 1.7–7.7)
Neutrophils Relative %: 52 %
Platelets: 210 10*3/uL (ref 150–400)
RBC: 4.46 MIL/uL (ref 3.87–5.11)
RDW: 12.4 % (ref 11.5–15.5)
WBC: 6.4 10*3/uL (ref 4.0–10.5)
nRBC: 0 % (ref 0.0–0.2)

## 2022-08-29 LAB — COMPREHENSIVE METABOLIC PANEL
ALT: 12 U/L (ref 0–44)
AST: 16 U/L (ref 15–41)
Albumin: 4.1 g/dL (ref 3.5–5.0)
Alkaline Phosphatase: 11 U/L — ABNORMAL LOW (ref 38–126)
Anion gap: 8 (ref 5–15)
BUN: 16 mg/dL (ref 6–20)
CO2: 27 mmol/L (ref 22–32)
Calcium: 9.4 mg/dL (ref 8.9–10.3)
Chloride: 101 mmol/L (ref 98–111)
Creatinine, Ser: 0.89 mg/dL (ref 0.44–1.00)
GFR, Estimated: >60 mL/min (ref >60)
Glucose, Bld: 106 mg/dL — ABNORMAL HIGH (ref 70–99)
Potassium: 3.4 mmol/L — ABNORMAL LOW (ref 3.5–5.1)
Sodium: 136 mmol/L (ref 135–145)
Total Bilirubin: 0.7 mg/dL (ref 0.3–1.2)
Total Protein: 7.1 g/dL (ref 6.5–8.1)

## 2022-08-29 LAB — LIPASE, BLOOD: Lipase: 52 U/L — ABNORMAL HIGH (ref 11–51)

## 2022-08-29 LAB — I-STAT BETA HCG BLOOD, ED (MC, WL, AP ONLY): I-stat hCG, quantitative: <5 m[IU]/mL (ref <5)

## 2022-08-29 LAB — TROPONIN I (HIGH SENSITIVITY)
Troponin I (High Sensitivity): 3 ng/L (ref <18)
Troponin I (High Sensitivity): 3 ng/L (ref <18)

## 2022-08-29 LAB — D-DIMER, QUANTITATIVE: D-Dimer, Quant: 0.27 ug/mL-FEU (ref 0.00–0.50)

## 2022-08-29 MED ORDER — HYDROCORTISONE 1 % EX CREA: TOPICAL_CREAM | CUTANEOUS | 0 refills | Status: AC

## 2022-08-29 MED ORDER — PREDNISONE 50 MG PO TABS: ORAL_TABLET | ORAL | 0 refills | Status: AC

## 2022-08-29 MED ORDER — HYDROXYZINE HCL 25 MG PO TABS: 25.0000 mg | ORAL_TABLET | Freq: Three times a day (TID) | ORAL | 0 refills | Status: AC | PRN

## 2022-08-29 MED ORDER — PREDNISONE 20 MG PO TABS
60.0000 mg | ORAL_TABLET | Freq: Once | ORAL | Status: AC
  Administered 2022-08-29: 60 mg via ORAL
  Filled 2022-08-29: qty 3

## 2022-08-29 MED ORDER — ALUM & MAG HYDROXIDE-SIMETH 200-200-20 MG/5ML PO SUSP
30.0000 mL | Freq: Once | ORAL | Status: AC
  Administered 2022-08-29: 30 mL via ORAL
  Filled 2022-08-29: qty 30

## 2022-08-29 MED ORDER — DIPHENHYDRAMINE HCL 25 MG PO CAPS
25.0000 mg | ORAL_CAPSULE | Freq: Once | ORAL | Status: AC
  Administered 2022-08-29: 25 mg via ORAL
  Filled 2022-08-29: qty 1

## 2022-08-29 NOTE — ED Provider Notes (Signed)
EMERGENCY DEPARTMENT AT [facility] Provider Note   CSN: [id number] Arrival date & time: 08/29/22  0107     History  No chief complaint on file.   Melissa Vang is a 37 y.o. female.  Patient with a history of anxiety and depression here with concern for bedbug exposure.  States she spent the night at her mother's house which had a known bedbug infestation.  She sustained multiple bites to her arms, legs, back and abdomen.  She is having a lot of itching despite taking Benadryl.  No difficulty breathing or difficulty swallowing.  She is noticed some generalized bodyaches and nausea.  Denies any known tick bites.  Denies any known fever.  No vomiting.  Also has had burning chest pain for the past 5 days that is fairly intermittent lasting for few seconds at a time that waxes and wanes in severity.  She describes burning central chest pain worse when she eats and worse when she takes a deep breath.  No cough or fever.  No cardiac or pulmonary history. Currently not prescribed anything for anxiety.  Did take some leftover hydroxyzine she had at home to see if that helps with her itching.  The history is provided by the patient.       Home Medications Prior to Admission medications   Medication Sig Start Date End Date Taking? Authorizing Provider  acetaminophen (TYLENOL) 500 MG tablet Take 500 mg by mouth every 6 (six) hours as needed for moderate pain.    [provider]  hydrOXYzine (ATARAX) 25 MG tablet Take 25 mg by mouth 3 (three) times daily as needed for anxiety.    [provider]  ibuprofen (ADVIL) 200 MG tablet Take 400 mg by mouth every 6 (six) hours as needed for moderate pain.    [provider]  ondansetron (ZOFRAN-ODT) 4 MG disintegrating tablet Take 1 tablet (4 mg total) by mouth every 6 (six) hours as needed for nausea. 04/15/22   Katrinka Blazing, [place], CNM  polyethylene glycol powder (GLYCOLAX/MIRALAX) 17 GM/SCOOP powder Take 17 g  by mouth 2 (two) times daily as needed for moderate constipation. 04/15/22   Katrinka Blazing, [place], CNM  promethazine (PHENERGAN) 25 MG tablet Take 1 tablet (25 mg total) by mouth every 6 (six) hours as needed for nausea or vomiting. 04/15/22   Katrinka Blazing, [place], CNM  sodium phosphate (FLEET) 7-19 GM/118ML ENEM Place 133 mLs (1 enema total) rectally daily as needed for severe constipation. 04/15/22   Katrinka Blazing, [place], CNM  terconazole (TERAZOL 7) 0.4 % vaginal cream Place 1 applicator vaginally at bedtime. Patient not taking: Reported on 06/04/2022 04/15/22   Dorathy Kinsman, [place]  Burr Medico 150-35 MCG/24HR transdermal patch Place 1 patch onto the skin once a week. 05/17/22   [provider]      Allergies    Amoxicillin, Penicillins, Morphine and codeine, and Molds & smuts    Review of Systems   Review of Systems  Constitutional:  Negative for activity change, appetite change and fever.  HENT:  Negative for congestion and rhinorrhea.   Respiratory:  Positive for chest tightness and shortness of breath.   Cardiovascular:  Negative for chest pain.  Gastrointestinal:  Negative for abdominal pain, nausea and vomiting.  Genitourinary:  Negative for dysuria and hematuria.  Musculoskeletal:  Positive for arthralgias and myalgias.  Skin:  Positive for rash.  Neurological:  Negative for dizziness, weakness and headaches.   all other systems are negative except as noted in the HPI and  PMH.    Physical Exam Updated Vital Signs BP 132/77 (BP Location: Left Arm)   Pulse (!) 57   Temp (!) 97.5 F (36.4 C)   Resp 16   Ht 5\' 4"  (1.626 m)   Wt 65.8 kg   LMP  (LMP Unknown)   SpO2 100%   BMI 24.89 kg/m  Physical Exam Vitals and nursing note reviewed.  Constitutional:      General: She is not in acute distress.    Appearance: She is well-developed. She is not ill-appearing.  HENT:     Head: Normocephalic and atraumatic.     Mouth/Throat:     Pharynx: No oropharyngeal exudate.     Comments: No tongue or  lip swelling Eyes:     Conjunctiva/sclera: Conjunctivae normal.     Pupils: Pupils are equal, round, and reactive to light.  Neck:     Comments: No meningismus. Cardiovascular:     Rate and Rhythm: Normal rate and regular rhythm.     Heart sounds: Normal heart sounds. No murmur heard. Pulmonary:     Effort: Pulmonary effort is normal. No respiratory distress.     Breath sounds: Normal breath sounds. No wheezing.  Chest:     Chest wall: No tenderness.  Abdominal:     Palpations: Abdomen is soft.     Tenderness: There is no abdominal tenderness. There is no guarding or rebound.  Musculoskeletal:        General: No tenderness. Normal range of motion.     Cervical back: Normal range of motion and neck supple.  Skin:    General: Skin is warm.     Findings: Rash present.     Comments: Scattered erythematous papules to bilateral forearms, bilateral ankles, back and abdomen.  Neurological:     Mental Status: She is alert and oriented to person, place, and time.     Cranial Nerves: No cranial nerve deficit.     Motor: No abnormal muscle tone.     Coordination: Coordination normal.     Comments: No ataxia on finger to nose bilaterally. No pronator drift. 5/5 strength throughout. CN 2-12 intact.Equal grip strength. Sensation intact.   Psychiatric:        Behavior: Behavior normal.     ED Results / Procedures / Treatments   Labs (all labs ordered are listed, but only abnormal results are displayed) Labs Reviewed  COMPREHENSIVE METABOLIC PANEL - Abnormal; Notable for the following components:      Result Value   Potassium 3.4 (*)    Glucose, Bld 106 (*)    Alkaline Phosphatase 11 (*)    All other components within normal limits  LIPASE, BLOOD - Abnormal; Notable for the following components:   Lipase 52 (*)    All other components within normal limits  CBC WITH DIFFERENTIAL/PLATELET  D-DIMER, QUANTITATIVE  I-STAT BETA HCG BLOOD, ED ([facility], [facility], [facility] ONLY)  TROPONIN I (HIGH  SENSITIVITY)  TROPONIN I (HIGH SENSITIVITY)    EKG EKG Interpretation  Date/Time:  Thursday Aug 29 2022 01:18:55 EDT Ventricular Rate:  52 PR Interval:  183 QRS Duration: 88 QT Interval:  449 QTC Calculation: 418 R Axis:   78 Text Interpretation: Sinus arrhythmia No significant change was found Confirmed by Glynn Octave [phone]) on 08/29/2022 1:28:18 AM  Radiology DG Chest 2 View  Result Date: 08/29/2022 CLINICAL DATA:  Chest pain for several days. EXAM: CHEST - 2 VIEW COMPARISON:  PA and lateral 02/13/2021 FINDINGS: The heart size and mediastinal contours  are within normal limits. Both lungs are clear. The visualized skeletal structures are unremarkable. IMPRESSION: No active cardiopulmonary disease.  Stable chest. Electronically Signed   By: Almira Bar M.D.   On: 08/29/2022 01:54    Procedures Procedures    Medications Ordered in ED Medications  predniSONE (DELTASONE) tablet 60 mg (has no administration in time range)  diphenhydrAMINE (BENADRYL) capsule 25 mg (has no administration in time range)    ED Course/ Medical Decision Making/ A&P                             Medical Decision Making Amount and/or Complexity of Data Reviewed Labs: ordered. Decision-making details documented in ED Course. Radiology: ordered and independent interpretation performed. Decision-making details documented in ED Course. ECG/medicine tests: ordered and independent interpretation performed. Decision-making details documented in ED Course.  Risk OTC drugs. Prescription drug management.   Suspected bug bite bites to arms and legs.  No tongue swelling or difficulty breathing.  No wheezing.  No hypoxia or increased work of breathing.  EKG with no acute ischemia.  Chest x-ray negative for infiltrate or pneumothorax.  Results reviewed interpreted by me.  Patient's chest pain description is atypical for ACS.  Labs are reassuring with negative troponin and negative D-dimer.  Chest x-ray  is negative. Low suspicion for ACS or pulmonary embolism. Chest pain may be GI in origin.  Continue PPI.  Blood alcohol, caffeine, NSAID medications and spicy foods.  Patient's itching has improved with topical steroids and p.o. steroids.  She is requesting a refill of Atarax which she has taken for anxiety in the past.  This will likely help her itching as well. No evidence of anaphylaxis.  Will issue a course of steroids systemically and topically for further treatment of her bug bites.  Return to the ED with exertional chest pain, pain associate shortness of breath, nausea, vomiting, sweating or other concerns.        Final Clinical Impression(s) / ED Diagnoses Final diagnoses:  Atypical chest pain  Bug bites    Rx / DC Orders ED Discharge Orders     None         Corben Auzenne, Jeannett Senior, MD 08/29/22 [phone]

## 2022-08-29 NOTE — Discharge Instructions (Addendum)
Take the steroids as prescribed.  Use Atarax as needed for itching. No evidence of heart attack or blood clot in the lung.  Follow-up with your doctor.  Return to the ED with exertional chest pain, pain associate with shortness of breath, nausea, vomiting, sweating or other concerns

## 2022-08-29 NOTE — ED Triage Notes (Signed)
Pt arrives c/o intermittent burning chest pain that has been ongoing x5 days. Pt states pain is worse when she eats and "tight" with deep breaths. C/o generalized body aches as well. Denies SOB, n/v.  Pt also states that she may have bed bug and has red patchy bite marks all over body. States that she was staying at her mothers house and found bed bugs in the home.

## 2022-11-13 ENCOUNTER — Ambulatory Visit: Payer: 59 | Admitting: Family Medicine

## 2022-11-20 ENCOUNTER — Ambulatory Visit: Payer: Medicaid Other | Admitting: Family Medicine

## 2022-12-26 ENCOUNTER — Ambulatory Visit: Payer: Self-pay | Admitting: Nurse Practitioner

## 2023-01-03 ENCOUNTER — Ambulatory Visit: Payer: Self-pay | Admitting: Nurse Practitioner

## 2023-04-09 IMAGING — CR DG CHEST 2V
2 series · 2 of 2 positions shown · non-contrast
Comparison: 12/02/2019

CLINICAL DATA: Chest pain, palpitation

EXAM:
CHEST - 2 VIEW

[w chest pa]
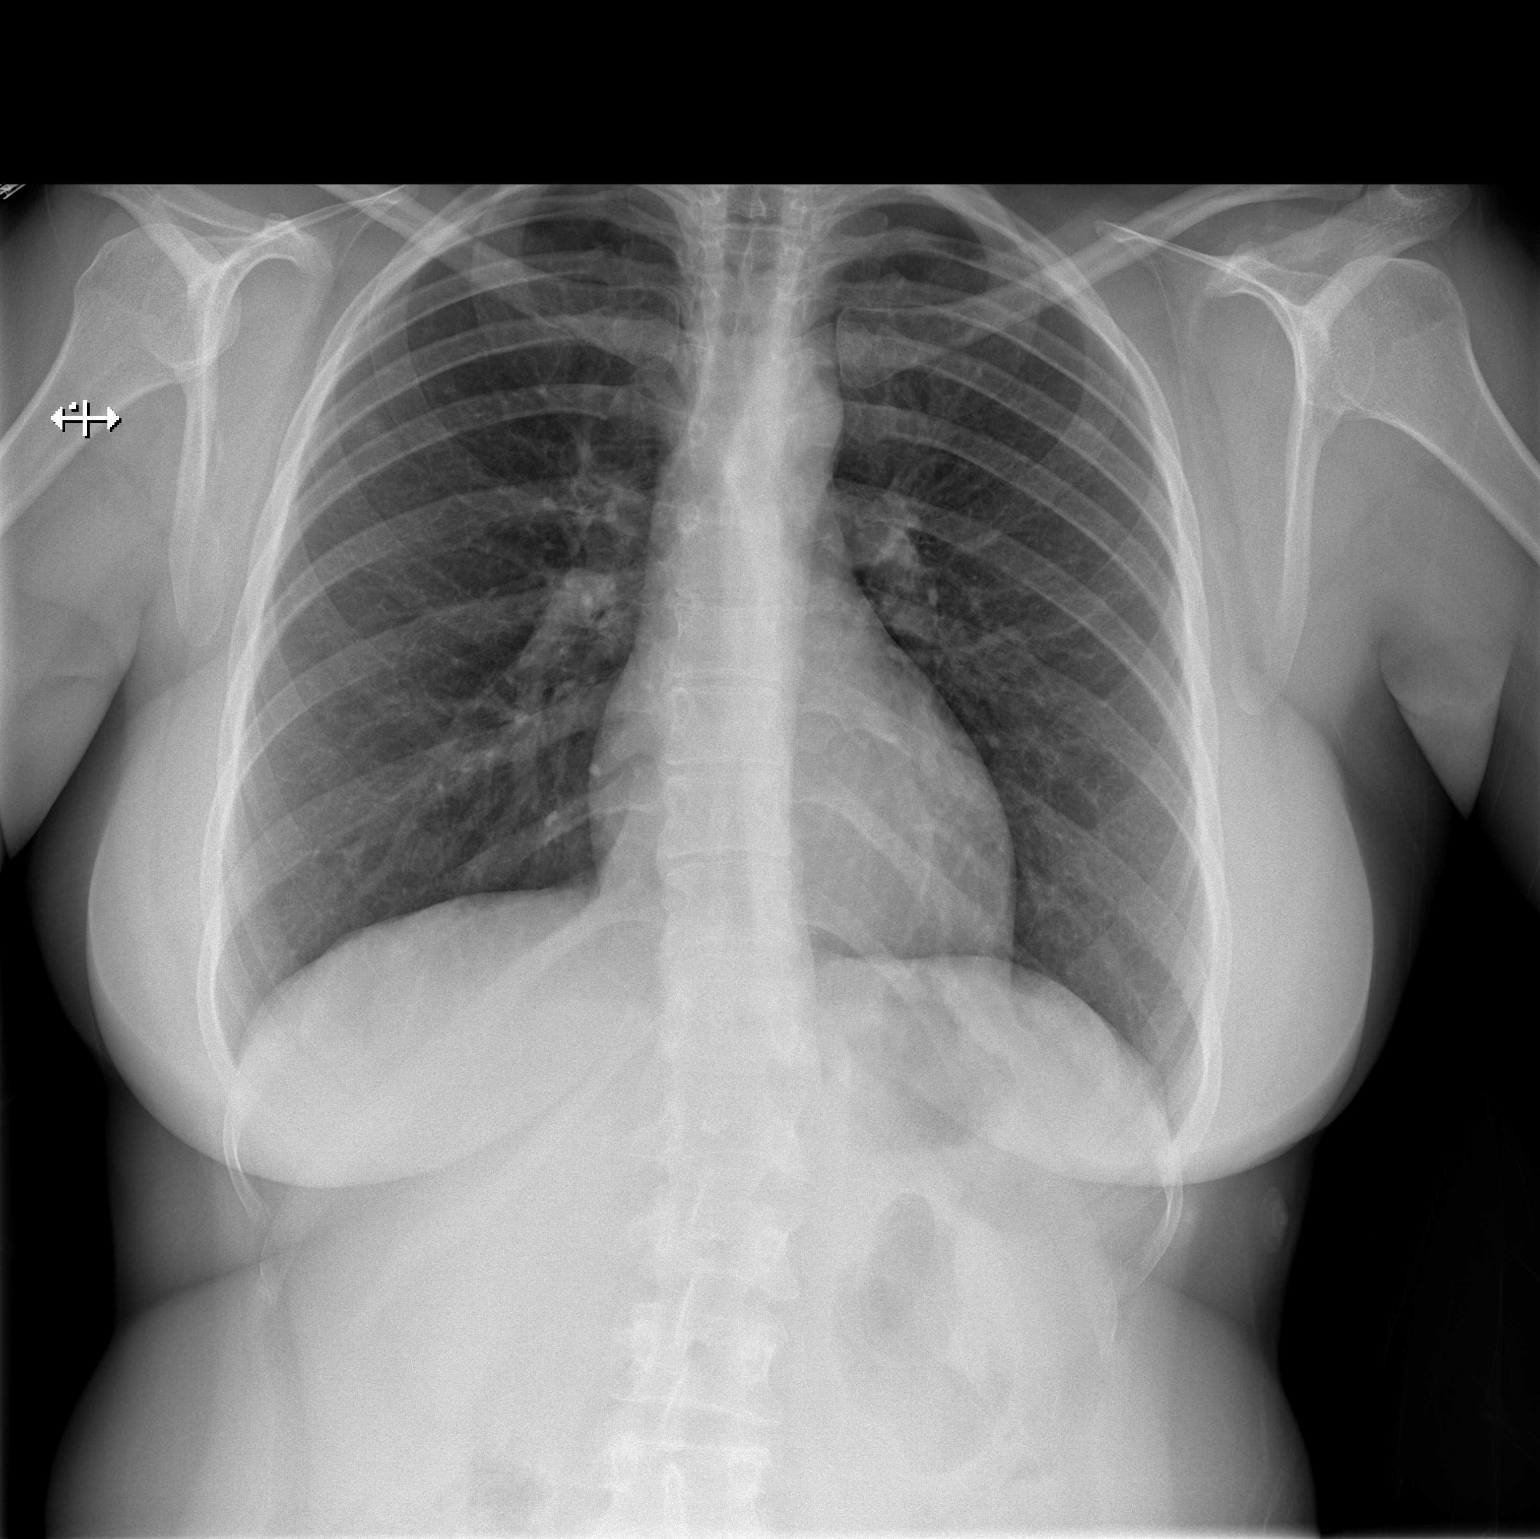

[w chest lat]
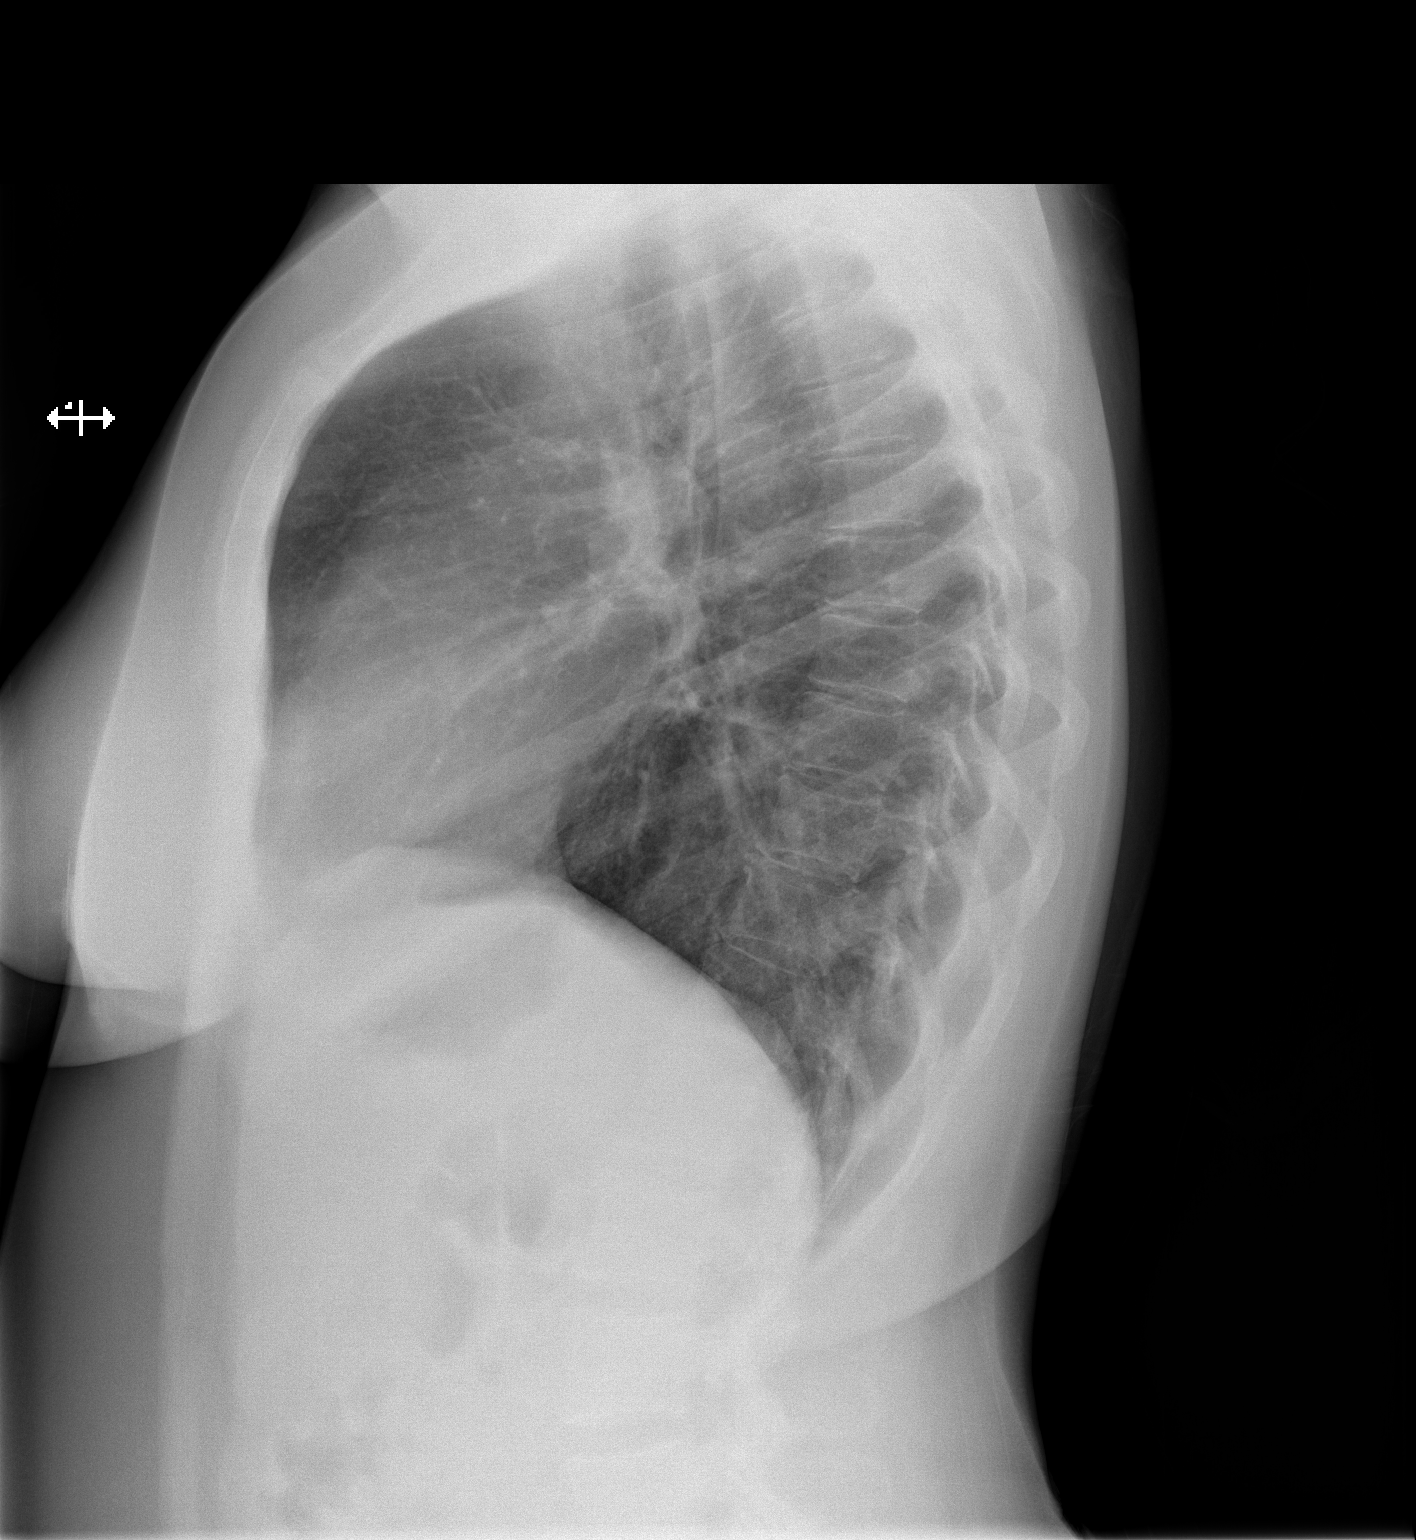

[2 of 2 positions shown; findings below may reference images not displayed]

FINDINGS: The heart size and mediastinal contours are within normal limits.
Both lungs are clear. The visualized skeletal structures are
unremarkable.
IMPRESSION: No active cardiopulmonary disease.

## 2023-12-08 ENCOUNTER — Ambulatory Visit: Admitting: Internal Medicine

## 2023-12-09 ENCOUNTER — Ambulatory Visit (INDEPENDENT_AMBULATORY_CARE_PROVIDER_SITE_OTHER): Admitting: Internal Medicine

## 2023-12-09 ENCOUNTER — Encounter: Payer: Self-pay | Admitting: Internal Medicine

## 2023-12-09 VITALS — BP 124/82 | HR 59 | Temp 97.9°F | Resp 20 | Ht 64.0 in | Wt 148.0 lb

## 2023-12-09 DIAGNOSIS — L299 Pruritus, unspecified: Secondary | ICD-10-CM

## 2023-12-09 DIAGNOSIS — J310 Chronic rhinitis: Secondary | ICD-10-CM

## 2023-12-09 DIAGNOSIS — K219 Gastro-esophageal reflux disease without esophagitis: Secondary | ICD-10-CM | POA: Diagnosis not present

## 2023-12-09 DIAGNOSIS — Z880 Allergy status to penicillin: Secondary | ICD-10-CM

## 2023-12-09 MED ORDER — FLUTICASONE PROPIONATE 50 MCG/ACT NA SUSP: 1.0000 | Freq: Two times a day (BID) | NASAL | 5 refills | Status: AC | PRN

## 2023-12-09 NOTE — Progress Notes (Signed)
 NEW PATIENT Date of Service/Encounter:   12/09/2023 Referring provider: none-self referred Primary care provider: Patient, No Pcp Per  Subjective:  Melissa Vang is a 38 y.o. female with a PMHx of depression, panic attack presenting today for evaluation of pruritus and concern for alpha gal.  History obtained from: chart review and patient.   Discussed the use of AI scribe software for clinical note transcription with the patient, who gave verbal consent to proceed.  History of Present Illness Melissa Vang is a 38 year old female who presents with chronic itching and suspected food allergies.  Chronic pruritus and oropharyngeal symptoms - Chronic itching, particularly on the face and head - Sensation of an 'itchy, dry throat' when eating but no specific triggers - No history of eczema or asthma - No anaphylaxis with current symptoms - Itching leads to fatigue, resulting in prolonged periods of bed rest - No chest pain, shortness of breath, or palpitations  Gastrointestinal symptoms associated with food intake - Diarrhea and loose stools, associated with dietary intake but no specific triggers - Symptoms particularly noted after consuming milk - Currently avoiding milk and mammalian meat after alpha gal testing positive, but had not previously noted issues with mammalian meat specifically - History of liquid in the small intestine during a previous hospital visit - No vomiting or nausea  Drug and environmental allergies - Allergies to amoxicillin, morphine , and penicillin, with past reactions including anaphylaxis - Discomfort when consuming foods containing mold, such as blue cheese  Current medication use - Hydroxyzine  25 mg once daily for anxiety and allergies - Generic Zyrtec daily as needed.  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     hydrocortisone  cream 1 % Apply to affected area 2 times daily 15 g 0   hydrOXYzine  (ATARAX ) 25 MG tablet Take 1 tablet (25 mg total) by mouth every 8 (eight) hours as needed for anxiety. 12 tablet 0   ibuprofen  (ADVIL ) 200 MG tablet Take 400 mg by mouth every 6 (six) hours as needed for moderate pain.     ondansetron  (ZOFRAN -ODT) 4 MG disintegrating tablet Take 1 tablet (4 mg total) by mouth every 6 (six) hours as needed for nausea. 20 tablet 3   polyethylene glycol powder (GLYCOLAX /MIRALAX ) 17 GM/SCOOP powder Take 17 g by mouth 2 (two) times daily as needed for moderate constipation. 500 g 3   predniSONE  (DELTASONE ) 50 MG tablet 1 tablet PO daily 5 tablet 0   promethazine  (PHENERGAN ) 25 MG tablet Take 1 tablet (25 mg total) by mouth every 6 (six) hours as needed for nausea or vomiting. 30 tablet 3   sodium phosphate  (FLEET) 7-19 GM/118ML ENEM Place 133 mLs (1 enema total) rectally daily as needed for severe constipation. 1 mL 3   XULANE 150-35 MCG/24HR transdermal patch Place 1 patch onto the skin once a week.     terconazole  (  TERAZOL 7 ) 0.4 % vaginal cream Place 1 applicator vaginally at bedtime. (Patient not taking: Reported on 12/09/2023) 45 g 1   No current facility-administered medications for this visit.   Known Allergies:  Allergies  Allergen Reactions   Amoxicillin Anaphylaxis, Swelling and Other (See Comments)    Has patient had a PCN reaction causing immediate rash, facial/tongue/throat swelling, SOB or lightheadedness with  hypotension: Yes Has patient had a PCN reaction causing severe rash involving mucus membranes or skin necrosis: Yes Has patient had a PCN reaction that required hospitalization: Unknown Has patient had a PCN reaction occurring within the last 10 years: No If all of the above answers are NO, then may proceed with Cephalosporin use.  Other reaction(s):  ([id number])   Penicillins Anaphylaxis, Swelling and Other (See Comments)    Childhood allergy/Throat swelling in particular Has patient had a PCN reaction causing immediate rash, facial/tongue/throat swelling, SOB or lightheadedness with hypotension: Yes Has patient had a PCN reaction causing severe rash involving mucus membranes or skin necrosis: Yes Has patient had a PCN reaction that required hospitalization: Yes Has patient had a PCN reaction occurring within the last 10 years: No If all of the above answers are NO, then may proceed with Cephalosporin use.    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  She helps take care of her mother locally and sometimes stays at her home.  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  She is a former smoker and now occasionally will use CBD and not nicotine vapes  ROS:  All other systems negative except as noted per HPI.  Objective:  Blood pressure 124/82, pulse (!) 59, temperature 97.9 F (36.6 C), temperature  source Oral, resp. rate 20, height 5' 4 (1.626 m), weight 148 lb (67.1 kg), SpO2 99%, unknown if currently breastfeeding. Body mass index is 25.4 kg/m. Physical Exam:  General Appearance:  Alert, cooperative, no distress, appears stated age  Head:  Normocephalic, without obvious abnormality, atraumatic  Eyes:  Conjunctiva clear, EOM's intact  Ears EACs normal bilaterally and normal TMs bilaterally  Nose: Nares normal, hypertrophic turbinates and normal mucosa  Throat: Lips, tongue normal; teeth and gums normal, normal posterior oropharynx  Neck: Supple, symmetrical  Lungs:   clear to auscultation bilaterally, Respirations unlabored, no coughing  Heart:  regular rate and rhythm and no murmur, Appears well perfused  Extremities: No edema  Neurologic: No gross deficits   Diagnostics:  Labs:  Lab Orders  No laboratory test(s) ordered today     Assessment and Plan  Assessment and Plan Assessment & Plan Chronic pruritus and suspected environmental allergies, no rash Chronic itching primarily affecting the face and head, with no history of testing for environmental allergies. Differential  includes environmental allergies, as food allergies typically present with more acute and pronounced symptoms. Chronic pruritus not attributed to suspected alpha-gal sensitivity. - Schedule skin testing for environmental allergies on Friday, September 5th at 2 PM. - Instruct to stop hydroxyzine  at least five days before testing. - Instruct to stop Zyrtec and famotidine  at least three days before testing. - Prescribe Flonase  for daily use. 1-2 spray daily.  Suspected alpha-gal sensitivity Low level positive alpha-gal test (0.58) with no history of severe allergic reactions to mammalian meat. Symptoms include gastrointestinal discomfort but no anaphylaxis and not obviously related to mammalian meat. Alpha-gal can cause delayed reactions and may not always present with symptoms. - Instruct to eliminate  mammalian meat for six weeks. - After six weeks, reintroduce mammalian meat and monitor for symptoms.  Medication allergies: amoxicillin, penicillin, and morphine  Anaphylaxis with amoxicillin and penicillin, characterized by hives and rash. Morphine  causes immediate burning sensation upon administration. Penicillin allergies can be outgrown, and testing can be done in a controlled setting to confirm current status. 80% of people outgrow penicillin allergy within ten years. - Plan for penicillin allergy testing at a future visit.  Gastrointestinal symptoms including gastroesophageal reflux and diarrhea Intermittent diarrhea and gastroesophageal reflux symptoms. Symptoms of diarrhea may be related to dietary intake or other gastrointestinal issues. - Advise that famotidine  20 mg can be taken twice daily if experiencing breakthrough symptoms. - consider GI evaluation for ongoing GI symptoms  Follow up : next Friday September 5th at 2 PM (1-68) must stop antihistamines 3 days prior to visit. It was a pleasure meeting you in clinic today! Thank you for allowing me to participate in your care.  Rocky Endow, MD [facility]       This note in its entirety was forwarded to the Provider who requested this consultation.  Other: none  Thank you for your kind referral. I appreciate the opportunity to take part in Faria's care. Please do not hesitate to contact me with questions.  Sincerely,  Rocky Endow, MD [facility] 

## 2023-12-09 NOTE — Patient Instructions (Addendum)
 Chronic pruritus and suspected environmental allergies, no rash Chronic itching primarily affecting the face and head, with no history of testing for environmental allergies. Differential includes environmental allergies, as food allergies typically present with more acute and pronounced symptoms. Chronic pruritus not attributed to suspected alpha-gal sensitivity. - Schedule skin testing for environmental allergies on Friday, September 5th at 2 PM. - Instruct to stop hydroxyzine  at least five days before testing. - Instruct to stop Zyrtec and famotidine  at least three days before testing. - Prescribe Flonase  for daily use. 1-2 spray daily.  Suspected alpha-gal sensitivity Low level positive alpha-gal test (0.58) with no history of severe allergic reactions to mammalian meat. Symptoms include gastrointestinal discomfort but no anaphylaxis and not obviously related to mammalian meat. Alpha-gal can cause delayed reactions and may not always present with symptoms. - Instruct to eliminate mammalian meat for six weeks. - After six weeks, reintroduce mammalian meat and monitor for symptoms.  Medication allergies: amoxicillin, penicillin, and morphine  Anaphylaxis with amoxicillin and penicillin, characterized by hives and rash. Morphine  causes immediate burning sensation upon administration. Penicillin allergies can be outgrown, and testing can be done in a controlled setting to confirm current status. 80% of people outgrow penicillin allergy within ten years. - Plan for penicillin allergy testing at a future visit.  Gastrointestinal symptoms including gastroesophageal reflux and diarrhea Intermittent diarrhea and gastroesophageal reflux symptoms. Symptoms of diarrhea may be related to dietary intake or other gastrointestinal issues. - Advise that famotidine  20 mg can be taken twice daily if experiencing breakthrough symptoms. - consider GI evaluation for ongoing GI symptoms  Follow up : next Friday  September 5th at 2 PM (1-68) must stop antihistamines 3 days prior to visit. It was a pleasure meeting you in clinic today! Thank you for allowing me to participate in your care.  Rocky Endow, MD [facility]

## 2023-12-19 ENCOUNTER — Encounter: Payer: Self-pay | Admitting: Internal Medicine

## 2023-12-19 ENCOUNTER — Ambulatory Visit (INDEPENDENT_AMBULATORY_CARE_PROVIDER_SITE_OTHER): Admitting: Internal Medicine

## 2023-12-19 DIAGNOSIS — L299 Pruritus, unspecified: Secondary | ICD-10-CM | POA: Diagnosis not present

## 2023-12-19 DIAGNOSIS — J310 Chronic rhinitis: Secondary | ICD-10-CM

## 2023-12-19 NOTE — Patient Instructions (Addendum)
 Chronic pruritus and suspected environmental allergies, no rash Chronic itching primarily affecting the face and head, with no history of testing for environmental allergies. Differential includes environmental allergies, as food allergies typically present with more acute and pronounced symptoms. Chronic pruritus not attributed to suspected alpha-gal sensitivity. Consider neurogenic itch.  - environmental skin testing 12/19/2023 negative; intradermal testing negative, labs for confirmation - Consider Flonase  for daily use. 1-2 spray daily. - continue zyrtec 10 mg daily as needed if helpful. Can increase to twice daily as needed.  - use sarna lotion as needed for itching - consider gabapentin - recent normal liver and kidney studies, normal blood counts, will look at thyroid  levels via blood work today.  Suspected alpha-gal sensitivity Low level positive alpha-gal test (0.58) with no history of severe allergic reactions to mammalian meat. Symptoms include gastrointestinal discomfort but no anaphylaxis and not obviously related to mammalian meat. Alpha-gal can cause delayed reactions and may not always present with symptoms. - Instruct to eliminate mammalian meat for six weeks. - After six weeks, reintroduce mammalian meat and monitor for symptoms.  Medication allergies: amoxicillin, penicillin, and morphine  Anaphylaxis with amoxicillin and penicillin, characterized by hives and rash. Morphine  causes immediate burning sensation upon administration. Penicillin allergies can be outgrown, and testing can be done in a controlled setting to confirm current status. 80% of people outgrow penicillin allergy within ten years. - Plan for penicillin allergy testing at a future visit.  Gastrointestinal symptoms including gastroesophageal reflux and diarrhea Intermittent diarrhea and gastroesophageal reflux symptoms. Symptoms of diarrhea may be related to dietary intake or other gastrointestinal issues. -  Advise that famotidine  20 mg can be taken twice daily if experiencing breakthrough symptoms. - consider GI evaluation for ongoing GI symptoms - Skin testing for most common food allergens - 12/19/2023- negative.  Follow up : 6 months, sooner if needed.  It was a pleasure seeing you again in clinic today! Thank you for allowing me to participate in your care.  Rocky Endow, MD [facility]

## 2023-12-19 NOTE — Progress Notes (Signed)
 Date of Service/Encounter:  12/19/23  Allergy  testing appointment   Initial visit on 12/09/23, seen for chronic pruritus, medication allergies, and reflux.  Please see that note for additional details.  Today reports for allergy  diagnostic testing:    DIAGNOSTICS:  Skin Testing: Environmental allergy  panel and select foods. Adequate positive and negative controls. Results discussed with patient/family.  Airborne Adult Perc - 12/19/23 1605     Time Antigen Placed 0300    Allergen Manufacturer Jestine    Location Back    Number of Test 68    1. Control-Buffer 50% Glycerol Negative    2. Control-Histamine 3+    3. Bahia Negative    4. French Southern Territories Negative    5. Johnson Negative    6. [place]  Blue Negative    7. Meadow Fescue Negative    8. Perennial Rye Negative    9. Timothy Negative    10. Ragweed Mix Negative    11. Cocklebur Negative    12. Plantain,  English Negative    13. Baccharis Negative    14. Dog Fennel Negative    15. Russian Thistle Negative    16. Lamb's Quarters Negative    17. Sheep Sorrell Negative    18. Rough Pigweed Negative    19. Marsh Elder, Rough Negative    20. Mugwort, Common Negative    21. Box, Elder Negative    22. Cedar, red Negative    23. Sweet Gum Negative    24. Pecan Pollen Negative    25. Pine Mix Negative    26. Walnut, Black Pollen Negative    27. Red Mulberry Negative    28. Ash Mix Negative    29. Birch Mix Negative    30. Beech American Negative    31. Cottonwood, Guinea[place] Negative    32. Hickory, White Negative    33. Maple Mix Negative    34. Oak, [place] Mix Negative    35. Sycamore Eastern Negative    36. Alternaria Alternata Negative    37. Cladosporium Herbarum Negative    38. Aspergillus Mix Negative    39. Penicillium Mix Negative    40. Bipolaris Sorokiniana (Helminthosporium) Negative    41. Drechslera Spicifera (Curvularia) Negative    42. Mucor Plumbeus Negative    43. Fusarium Moniliforme Negative    44.  Aureobasidium Pullulans (pullulara) Negative    45. Rhizopus Oryzae Negative    46. Botrytis Cinera Negative    47. Epicoccum Nigrum Negative    48. Phoma Betae Negative    49. Dust Mite Mix Negative    50. Cat Hair 10,000 BAU/ml Negative    51.  Dog Epithelia Negative    52. Mixed Feathers Negative    53. Horse Epithelia Negative    54. Cockroach, German Negative    55. Tobacco Leaf Negative          Intradermal - 12/19/23 1601     Time Antigen Placed 0300    Allergen Manufacturer Jestine    Location Arm    Number of Test 16    Intradermal Select    Control Negative    Bahia Negative    French Southern Territories Negative    Johnson Negative    7 Grass Negative    Ragweed Mix Negative    Weed Mix Negative    Tree Mix Negative    Mold 1 Negative    Mold 2 Negative    Mold 3 Negative    Mold 4 Negative    Mite  Mix Negative    Cat Negative    Dog Negative    Cockroach Negative          Food Adult Perc - 12/19/23 1500     Time Antigen Placed 0145    Allergen Manufacturer Jestine    Location Back    Number of allergen test 13    1. Peanut Negative    2. Soybean Negative    3. Wheat Negative    4. Sesame Negative    5. Milk, Cow Negative    6. Casein Negative    7. Egg White, Chicken Negative    8. Shellfish Mix Negative    9. Fish Mix Negative    10. Cashew Negative    11. Walnut Food Negative    12. Almond Negative    13. Hazelnut Negative          Allergy testing results were read and interpreted by myself, documented by clinical staff.  Patient provided with copy of allergy testing along with avoidance measures when indicated.   Rocky Endow, MD  [facility] 

## 2023-12-20 LAB — TSH+FREE T4
Free T4: 1.23 ng/dL (ref 0.82–1.77)
TSH: 2.39 u[IU]/mL (ref 0.450–4.500)

## 2023-12-23 LAB — IGE+ALLERGENS ZONE 2(30)
Alternaria Alternata IgE: <0.1 kU/L
Amer Sycamore IgE Qn: <0.1 kU/L
Aspergillus Fumigatus IgE: <0.1 kU/L
Bahia Grass IgE: <0.1 kU/L
Bermuda Grass IgE: <0.1 kU/L
Cat Dander IgE: <0.1 kU/L
Cedar, Mountain IgE: <0.1 kU/L
Cladosporium Herbarum IgE: <0.1 kU/L
Cockroach, American IgE: <0.1 kU/L
Common Silver Birch IgE: <0.1 kU/L
D Farinae IgE: 0.1 kU/L — AB
D Pteronyssinus IgE: 0.13 kU/L — AB
Dog Dander IgE: <0.1 kU/L
Elm, American IgE: <0.1 kU/L
Hickory, White IgE: <0.1 kU/L
IgE (Immunoglobulin E), Serum: 12 [IU]/mL (ref 6–495)
Johnson Grass IgE: <0.1 kU/L
Maple/Box Elder IgE: <0.1 kU/L
Mucor Racemosus IgE: <0.1 kU/L
Mugwort IgE Qn: <0.1 kU/L
Nettle IgE: <0.1 kU/L
Oak, White IgE: <0.1 kU/L
Penicillium Chrysogen IgE: <0.1 kU/L
Pigweed, Rough IgE: <0.1 kU/L
Plantain, English IgE: <0.1 kU/L
Ragweed, Short IgE: <0.1 kU/L
Sheep Sorrel IgE Qn: <0.1 kU/L
Stemphylium Herbarum IgE: <0.1 kU/L
Sweet gum IgE RAST Ql: <0.1 kU/L
Timothy Grass IgE: <0.1 kU/L
White Mulberry IgE: <0.1 kU/L

## 2023-12-24 ENCOUNTER — Ambulatory Visit: Payer: Self-pay | Admitting: Internal Medicine

## 2023-12-24 NOTE — Progress Notes (Signed)
 Please let patient know that her blood work was negative to the environmental panel.  Her thyroid  levels were also within normal limits ruling out thyroid  disease as the cause of her itch.

## 2023-12-26 NOTE — Progress Notes (Signed)
 No returned with questions or concerns message closed
